# Patient Record
Sex: Female | Born: 1935 | Race: Black or African American | Hispanic: No | Smoking: Never smoker
Health system: Southern US, Community
[De-identification: ages and names within clinical notes are randomized; demographics above are authoritative.]

## PROBLEM LIST (undated history)

## (undated) DIAGNOSIS — N393 Stress incontinence (female) (male): Secondary | ICD-10-CM

## (undated) DIAGNOSIS — I6529 Occlusion and stenosis of unspecified carotid artery: Secondary | ICD-10-CM

## (undated) DIAGNOSIS — I4891 Unspecified atrial fibrillation: Secondary | ICD-10-CM

## (undated) DIAGNOSIS — N39490 Overflow incontinence: Secondary | ICD-10-CM

## (undated) DIAGNOSIS — I639 Cerebral infarction, unspecified: Secondary | ICD-10-CM

## (undated) HISTORY — PX: EYE SURGERY: SHX253

## (undated) HISTORY — DX: Occlusion and stenosis of unspecified carotid artery: I65.29

## (undated) HISTORY — DX: Unspecified atrial fibrillation: I48.91

## (undated) HISTORY — PX: CATARACT EXTRACTION: SUR2

---

## 2011-11-08 ENCOUNTER — Other Ambulatory Visit: Payer: Self-pay

## 2011-11-08 DIAGNOSIS — I6529 Occlusion and stenosis of unspecified carotid artery: Secondary | ICD-10-CM

## 2011-11-11 ENCOUNTER — Other Ambulatory Visit (INDEPENDENT_AMBULATORY_CARE_PROVIDER_SITE_OTHER): Payer: Medicare Other | Admitting: *Deleted

## 2011-11-11 ENCOUNTER — Encounter: Payer: Self-pay | Admitting: Vascular Surgery

## 2011-11-11 ENCOUNTER — Ambulatory Visit (INDEPENDENT_AMBULATORY_CARE_PROVIDER_SITE_OTHER): Payer: Medicare Other | Admitting: Vascular Surgery

## 2011-11-11 VITALS — BP 146/75 | HR 49 | Resp 18 | Ht 64.0 in | Wt 104.0 lb

## 2011-11-11 DIAGNOSIS — I63239 Cerebral infarction due to unspecified occlusion or stenosis of unspecified carotid arteries: Secondary | ICD-10-CM

## 2011-11-11 DIAGNOSIS — I6529 Occlusion and stenosis of unspecified carotid artery: Secondary | ICD-10-CM | POA: Insufficient documentation

## 2011-11-11 NOTE — Progress Notes (Signed)
Subjective:     Patient ID: Megan Davis, female   DOB: Sep 06, 1922, 78 y.o.   MRN: [id number]  HPI this 78 year old female is referred by Dr. Elby Beck for severe carotid occlusive disease and a recent left brain CVA. 6 days ago this patient developed right hemiparesis and was found to have a small left brain CVA by MRI scanning. She also was found to have a very tight left ICA stenosis by MR angiography. The patient regained normal neurologic function within 24 hours and was eventually discharged from the hospital and referred for possible carotid surgery. She was seen to have an acute nonhemorrhagic infarct in the posterior left insular cortex.   Past Medical History  Diagnosis Date  . Carotid artery occlusion     History  Substance Use Topics  . Smoking status: Never Smoker   . Smokeless tobacco: Never Used  . Alcohol Use: No    No family history on file.  No Known Allergies  Current outpatient prescriptions:calcium-vitamin D (OSCAL) 250-125 MG-UNIT per tablet, Take 1 tablet by mouth daily., Disp: , Rfl: ;  Multiple Vitamin (MULTIVITAMIN) tablet, Take 1 tablet by mouth daily., Disp: , Rfl:   BP 146/75  Pulse 49  Resp 18  Ht 5\' 4"  (1.626 m)  Wt 104 lb (47.174 kg)  BMI 17.85 kg/m2  Body mass index is 17.85 kg/(m^2).          Review of Systems complains of occasional dizziness but denies chest pain, dyspnea on exertion, PND, orthopnea, hemoptysis. Patient is frail but does ambulate significant amounts at home. Other systems negative and complete review of systems    Objective:   Physical Exam blood pressure 146/75 heart rate 49 respirations 18 Gen.-alert and oriented x3 in no apparent distress HEENT normal for age Lungs no rhonchi or wheezing Cardiovascular regular rhythm no murmurs carotid pulses 3+ palpable no bruits audible Abdomen soft nontender no palpable masses Musculoskeletal free of  major deformities Skin clear -no rashes Neurologic normal Lower  extremities 3+ femoral  palpable bilaterally with no edema-both feet well perfused  Today I reviewed the clinical data from previous hospitalization at [facility]. Also ordered a carotid duplex exam which I reviewed and interpreted. There is a very tight greater than 95% left ICA stenosis. The right ICA was not studied today in our office but is thought to be approximately 70% stenotic by MR angiography     Assessment:     #1 acute left nonhemorrhagic CVA in left insular cortex-6 days ago-symptoms completely resolved after 24 hours #2 greater than 95% left ICA stenosis #3 moderate right ICA stenosis by MR angiography #4 no history coronary artery disease    Plan:     Patient needs left carotid endarterectomy. Have scheduled this for Wednesday, October 16. Have thoroughly discussed risks and benefits with patient and family including 3% risk of stroke. Patient and family would like to proceed and will schedule her for next Wednesday October 16. Patient will continue aspirin daily and if has any new neurologic symptoms will be in touch with [place] immediately

## 2011-11-13 ENCOUNTER — Other Ambulatory Visit: Payer: Self-pay

## 2011-11-13 ENCOUNTER — Encounter (HOSPITAL_COMMUNITY): Payer: Self-pay | Admitting: Pharmacy Technician

## 2011-11-15 ENCOUNTER — Encounter (HOSPITAL_COMMUNITY)
Admission: RE | Admit: 2011-11-15 | Discharge: 2011-11-15 | Disposition: A | Discharge status: Home / Self Care | Payer: Medicare PPO | Source: Ambulatory Visit | Attending: Vascular Surgery | Admitting: Vascular Surgery

## 2011-11-15 ENCOUNTER — Encounter (HOSPITAL_COMMUNITY): Payer: Self-pay

## 2011-11-15 ENCOUNTER — Other Ambulatory Visit (HOSPITAL_COMMUNITY): Payer: Medicare Other

## 2011-11-15 DIAGNOSIS — Z01810 Encounter for preprocedural cardiovascular examination: Secondary | ICD-10-CM | POA: Insufficient documentation

## 2011-11-15 DIAGNOSIS — I6529 Occlusion and stenosis of unspecified carotid artery: Secondary | ICD-10-CM | POA: Insufficient documentation

## 2011-11-15 DIAGNOSIS — Z01812 Encounter for preprocedural laboratory examination: Secondary | ICD-10-CM | POA: Insufficient documentation

## 2011-11-15 DIAGNOSIS — Z01818 Encounter for other preprocedural examination: Secondary | ICD-10-CM | POA: Insufficient documentation

## 2011-11-15 HISTORY — DX: Stress incontinence (female) (male): N39.3

## 2011-11-15 HISTORY — DX: Overflow incontinence: N39.490

## 2011-11-15 HISTORY — DX: Cerebral infarction, unspecified: I63.9

## 2011-11-15 LAB — CBC
Hemoglobin: 10.5 g/dL — ABNORMAL LOW (ref 12.0–15.0)
Platelets: 163 10*3/uL (ref 150–400)
RBC: 3.65 MIL/uL — ABNORMAL LOW (ref 3.87–5.11)

## 2011-11-15 LAB — COMPREHENSIVE METABOLIC PANEL
ALT: 25 U/L (ref 0–35)
AST: 34 U/L (ref 0–37)
Alkaline Phosphatase: 63 U/L (ref 39–117)
CO2: 25 mEq/L (ref 19–32)
GFR calc Af Amer: 35 mL/min — ABNORMAL LOW (ref >90)
GFR calc non Af Amer: 31 mL/min — ABNORMAL LOW (ref >90)
Glucose, Bld: 81 mg/dL (ref 70–99)
Potassium: 4.8 mEq/L (ref 3.5–5.1)
Sodium: 138 mEq/L (ref 135–145)
Total Protein: 7.6 g/dL (ref 6.0–8.3)

## 2011-11-15 LAB — URINE MICROSCOPIC-ADD ON

## 2011-11-15 LAB — PROTIME-INR
INR: 1.11 (ref 0.00–1.49)
Prothrombin Time: 14.2 seconds (ref 11.6–15.2)

## 2011-11-15 LAB — URINALYSIS, ROUTINE W REFLEX MICROSCOPIC
Glucose, UA: NEGATIVE mg/dL
Ketones, ur: NEGATIVE mg/dL
Nitrite: NEGATIVE
Specific Gravity, Urine: 1.013 (ref 1.005–1.030)
pH: 5.5 (ref 5.0–8.0)

## 2011-11-15 LAB — TYPE AND SCREEN

## 2011-11-15 LAB — APTT: aPTT: 37 seconds (ref 24–37)

## 2011-11-15 NOTE — Progress Notes (Addendum)
LM at [facility], medical records, requesting old EKG, last office visit. Per pt's family she sees Dr. Archie Balboa.   Left chart for review of today's EKG, CXR.

## 2011-11-15 NOTE — Pre-Procedure Instructions (Signed)
[address] Megan Davis  11/15/2011   Your procedure is scheduled on:  Wednesday October 16  Report to [facility] at 8:30 AM.  Call this number if you have problems the morning of surgery: [phone]   Remember:   Do not eat or drink:After Midnight.    Take these medicines the morning of surgery with A SIP OF WATER: none   Do not wear jewelry, make-up or nail polish.  Do not wear lotions, powders, or perfumes. You may wear deodorant.  Do not shave 48 hours prior to surgery. Men may shave face and neck.  Do not bring valuables to the hospital.  Contacts, dentures or bridgework may not be worn into surgery.  Leave suitcase in the car. After surgery it may be brought to your room.  For patients admitted to the hospital, checkout time is 11:00 AM the day of discharge.   Patients discharged the day of surgery will not be allowed to drive home.  Name and phone number of your driver: na  Special Instructions: Shower using CHG 2 nights before surgery and the night before surgery.  If you shower the day of surgery use CHG.  Use special wash - you have one bottle of CHG for all showers.  You should use approximately 1/3 of the bottle for each shower.   Please read over the following fact sheets that you were given: Pain Booklet, Coughing and Deep Breathing, Blood Transfusion Information and Surgical Site Infection Prevention

## 2011-11-18 ENCOUNTER — Encounter (HOSPITAL_COMMUNITY): Payer: Self-pay | Admitting: Vascular Surgery

## 2011-11-18 NOTE — Consult Note (Signed)
Anesthesia Chart Review:  Patient is a 78 year old female scheduled for left CEA on 11/20/11 by Dr. Hart Rochester.  History includes recent left brain CVA ~ 11/05/11 with return to baseline neurologic function within 24 hours, non-smoker, stress incontinence.  She is followed at the [facility]), with PCP noted as Dr. Carmin Richmond.  His note from 10/09/11 and also mentions a history of murmur (not specified), HTN, dyslipidemia, and CKD.  Records from [facility] indicate that she has severe AS.  Labs noted.  Cr 1.49.  H/H 10.5/32.5.    CXR on 11/15/11 showed: Right suprahilar fullness. Adenopathy or hilar mass cannot be excluded although the appearance could be due to pulmonary hypertension in the setting of emphysema.  Follow-up chest CT recommended, preferably with infusion.   EKG on 11/15/11 showed SB @ 54 bpm, possible LAE, LVH, T wave abnormality, consider lateral ischemia. Records requested from [facility]) this morning and just arrived.  Her lateral ST/T wave changes were more non-specific on her 11/06/11 EKG from [facility].  Echo on 11/07/11 showed severe and EF, mean gradient of aortic valve is 33 mmHg, aortic valve area by VTI is calculated at 0.84 cm, difficult to rule out possible vegetation atrial side of anterior mitral leaflet, difficult to be conclusive due to myxomatous changes and mitral annular calcification. Suggest TEE if indicated with history of CVA and to rule out SBE if clinical presentation consistent. The mitral valve leaflets are moderately thickened, there is no evidence of mild regurgitation, estimated EF greater than 65%, left ventricle appears hyperdynamic, Doppler findings suggestive of diastolic dysfunction, left ventricular diastolic filling pattern is consistent with elevated mean left atrial pressure, moderate tricuspid regurgitation, the right ventricle systolic pressures estimated to be 35-40 mmHg, evidence of mild pulmonary hypertension,  inferior vena cava appears normal in size, less than 50% respiratory changes inferior vena cava dimension.  Carotid duplex on 11/07/11 [facility]) showed 70-99% left ICA stenosis, 50-69% right ICA stenosis.  I reviewed above with Anesthesiologist Dr. Krista Blue who agrees patient should undergo a cardiology evaluation for abnormal EKG and severe AS.  I notified Okey Regal at [facility] and also notified her of patient's abnormal CXR.  Dr. Hart Rochester plans to postpone surgery until patient is cleared by [facility].  Shonna Chock, PA-C

## 2011-11-19 ENCOUNTER — Telehealth: Payer: Self-pay | Admitting: Vascular Surgery

## 2011-11-19 ENCOUNTER — Encounter: Payer: Self-pay | Admitting: Vascular Surgery

## 2011-11-19 ENCOUNTER — Other Ambulatory Visit: Payer: Self-pay

## 2011-11-19 ENCOUNTER — Other Ambulatory Visit: Payer: Self-pay | Admitting: *Deleted

## 2011-11-19 DIAGNOSIS — Z01818 Encounter for other preprocedural examination: Secondary | ICD-10-CM

## 2011-11-19 DIAGNOSIS — I6529 Occlusion and stenosis of unspecified carotid artery: Secondary | ICD-10-CM

## 2011-11-19 NOTE — Telephone Encounter (Signed)
Message copied by Fredrich Birks on Tue Nov 19, 2011  2:58 PM ------      Message from: Phillips Odor      Created: Mon Nov 18, 2011  4:53 PM      Regarding: needs cardiac clearance      Contact: [phone]       Please schedule pt. For a cardiology consult with [facility].  Pt's. surgery for carotid endarterectomy was cancelled due to need for Cardiac clearance.  Please try to get the CC ASAP, so her CEA can get rescheduled.

## 2011-11-19 NOTE — Telephone Encounter (Signed)
[facility] could not see pt for 2 weeks, so I scheduled with [facility] for 11/22/11 @ 2:15pm. Notified daughter Burnell Blanks, dpm

## 2011-11-20 ENCOUNTER — Encounter (HOSPITAL_COMMUNITY): Admission: RE | Payer: Self-pay | Source: Ambulatory Visit

## 2011-11-20 ENCOUNTER — Ambulatory Visit (HOSPITAL_COMMUNITY): Admission: RE | Admit: 2011-11-20 | Payer: Medicare PPO | Source: Ambulatory Visit | Admitting: Vascular Surgery

## 2011-11-20 SURGERY — ENDARTERECTOMY, CAROTID
Anesthesia: General | Site: Neck | Laterality: Left

## 2011-11-28 ENCOUNTER — Ambulatory Visit: Payer: Medicare Other | Admitting: Cardiovascular Disease

## 2011-12-04 ENCOUNTER — Encounter: Payer: Self-pay | Admitting: *Deleted

## 2011-12-05 ENCOUNTER — Other Ambulatory Visit: Payer: Self-pay | Admitting: *Deleted

## 2011-12-11 MED ORDER — DEXTROSE 5 % IV SOLN
1.5000 g | INTRAVENOUS | Status: AC
  Administered 2011-12-12: 1.5 g via INTRAVENOUS
  Filled 2011-12-11: qty 1.5

## 2011-12-11 NOTE — Consult Note (Signed)
Anesthesia Chart Review: Patient is a 78 year old female scheduled for left CEA on 12/12/11 11/20/11 by Dr. Hart Rochester. She was initially scheduled for 11/20/11, but cancelled due to need for cardiac evaluation due to finding of severe AS on recent echo.  She is scheduled to be a same day work-up.  History includes recent left brain CVA ~ 11/05/11 with return to baseline neurologic function within 24 hours, non-smoker, stress incontinence, murmur (severe AS by echo 11/2011), HTN, dyslipidemia, and CKD.  She is followed at the [facility] FP/[facility]), with PCP noted as Dr. Carmin Richmond.  CXR on 11/15/11 showed:  Right suprahilar fullness. Adenopathy or hilar mass cannot be excluded although the appearance could be due to pulmonary hypertension in the setting of emphysema.  Follow-up chest CT recommended, preferably with infusion. (I called this result to Lamar Blinks, RNon 11/18/11.)  Leandro Reasoner, RN to re-request EKG and echo from [facility], but according to my note on 11/18/11: EKG on 11/15/11 showed SB @ 54 bpm, possible LAE, LVH, T wave abnormality, consider lateral ischemia. Her lateral ST/T wave changes were more non-specific on her 11/06/11 EKG from [facility].   Echo on 11/07/11 showed severe and EF, mean gradient of aortic valve is 33 mmHg, aortic valve area by VTI is calculated at 0.84 cm, difficult to rule out possible vegetation atrial side of anterior mitral leaflet, difficult to be conclusive due to myxomatous changes and mitral annular calcification. Suggest TEE if indicated with history of CVA and to rule out SBE if clinical presentation consistent. The mitral valve leaflets are moderately thickened, there is no evidence of mild regurgitation, estimated EF greater than 65%, left ventricle appears hyperdynamic, Doppler findings suggestive of diastolic dysfunction, left ventricular diastolic filling pattern is consistent with elevated mean left atrial pressure,  moderate tricuspid regurgitation, the right ventricle systolic pressures estimated to be 35-40 mmHg, evidence of mild pulmonary hypertension, inferior vena cava appears normal in size, less than 50% respiratory changes inferior vena cava dimension.   Carotid duplex on 11/07/11 [facility]) showed 70-99% left ICA stenosis, 50-69% right ICA stenosis.   Patient has since been evaluated by Dr. Nicki Guadalajara [facility]).  She had a nuclear stress test on 11/27/2011 that showed normal myocardial perfusion study, no significant ischemia demonstrated. Post stress EF 80%. No significant wall motion abnormalities. Low risk scan.  He also reviewed her echo from [facility].  He ultimately cleared her for this procedure with moderate cardiovascular risk with recommendations to avoid hypotension.  Patient is for labs on arrival.  She will be evaluated by her assigned anesthesiologist on the day of surgery.  Shonna Chock, PA-C 12/11/11 1411

## 2011-12-11 NOTE — Progress Notes (Signed)
Located chart from prior PAT appointment. ECHO, EKG, stress test, cardiac clearance note from [facility] on chart.

## 2011-12-11 NOTE — Progress Notes (Signed)
spoke with [facility] hospital medical records they do not have a stress test but they will send echo and ekg.

## 2011-12-12 ENCOUNTER — Inpatient Hospital Stay (HOSPITAL_COMMUNITY)
Admission: RE | Admit: 2011-12-12 | Discharge: 2011-12-13 | DRG: 039 | Disposition: A | Discharge status: Home / Self Care | Payer: Medicare PPO | Source: Ambulatory Visit | Attending: Vascular Surgery | Admitting: Vascular Surgery

## 2011-12-12 ENCOUNTER — Encounter (HOSPITAL_COMMUNITY): Payer: Self-pay | Admitting: *Deleted

## 2011-12-12 ENCOUNTER — Encounter (HOSPITAL_COMMUNITY): Payer: Self-pay | Admitting: Vascular Surgery

## 2011-12-12 ENCOUNTER — Inpatient Hospital Stay (HOSPITAL_COMMUNITY): Payer: Medicare PPO | Admitting: Vascular Surgery

## 2011-12-12 ENCOUNTER — Encounter (HOSPITAL_COMMUNITY)
Admission: RE | Disposition: A | Discharge status: Home / Self Care | Payer: Self-pay | Source: Ambulatory Visit | Attending: Vascular Surgery

## 2011-12-12 ENCOUNTER — Telehealth: Payer: Self-pay | Admitting: Vascular Surgery

## 2011-12-12 DIAGNOSIS — I2789 Other specified pulmonary heart diseases: Secondary | ICD-10-CM | POA: Diagnosis present

## 2011-12-12 DIAGNOSIS — N189 Chronic kidney disease, unspecified: Secondary | ICD-10-CM | POA: Diagnosis present

## 2011-12-12 DIAGNOSIS — I63239 Cerebral infarction due to unspecified occlusion or stenosis of unspecified carotid arteries: Secondary | ICD-10-CM

## 2011-12-12 DIAGNOSIS — Z79899 Other long term (current) drug therapy: Secondary | ICD-10-CM

## 2011-12-12 DIAGNOSIS — Z8673 Personal history of transient ischemic attack (TIA), and cerebral infarction without residual deficits: Secondary | ICD-10-CM

## 2011-12-12 DIAGNOSIS — R011 Cardiac murmur, unspecified: Secondary | ICD-10-CM | POA: Diagnosis present

## 2011-12-12 DIAGNOSIS — I6529 Occlusion and stenosis of unspecified carotid artery: Principal | ICD-10-CM | POA: Diagnosis present

## 2011-12-12 DIAGNOSIS — I079 Rheumatic tricuspid valve disease, unspecified: Secondary | ICD-10-CM | POA: Diagnosis present

## 2011-12-12 DIAGNOSIS — I080 Rheumatic disorders of both mitral and aortic valves: Secondary | ICD-10-CM | POA: Diagnosis present

## 2011-12-12 DIAGNOSIS — I129 Hypertensive chronic kidney disease with stage 1 through stage 4 chronic kidney disease, or unspecified chronic kidney disease: Secondary | ICD-10-CM | POA: Diagnosis present

## 2011-12-12 DIAGNOSIS — N393 Stress incontinence (female) (male): Secondary | ICD-10-CM | POA: Diagnosis present

## 2011-12-12 DIAGNOSIS — I658 Occlusion and stenosis of other precerebral arteries: Secondary | ICD-10-CM | POA: Diagnosis present

## 2011-12-12 DIAGNOSIS — E785 Hyperlipidemia, unspecified: Secondary | ICD-10-CM | POA: Diagnosis present

## 2011-12-12 HISTORY — PX: PATCH ANGIOPLASTY: SHX6230

## 2011-12-12 HISTORY — PX: ENDARTERECTOMY: SHX5162

## 2011-12-12 LAB — CBC
MCH: 29.3 pg (ref 26.0–34.0)
MCV: 90.3 fL (ref 78.0–100.0)
Platelets: 153 10*3/uL (ref 150–400)
RDW: 12.4 % (ref 11.5–15.5)
WBC: 4.5 10*3/uL (ref 4.0–10.5)

## 2011-12-12 LAB — COMPREHENSIVE METABOLIC PANEL
ALT: 14 U/L (ref 0–35)
AST: 22 U/L (ref 0–37)
Albumin: 3.6 g/dL (ref 3.5–5.2)
Calcium: 10 mg/dL (ref 8.4–10.5)
Chloride: 103 mEq/L (ref 96–112)
Creatinine, Ser: 1.54 mg/dL — ABNORMAL HIGH (ref 0.50–1.10)
Sodium: 140 mEq/L (ref 135–145)

## 2011-12-12 LAB — PROTIME-INR
INR: 1.07 (ref 0.00–1.49)
Prothrombin Time: 13.8 seconds (ref 11.6–15.2)

## 2011-12-12 LAB — TYPE AND SCREEN: ABO/RH(D): O POS

## 2011-12-12 SURGERY — ENDARTERECTOMY, CAROTID
Anesthesia: General | Site: Neck | Laterality: Left | Wound class: Clean

## 2011-12-12 MED ORDER — SODIUM CHLORIDE 0.9 % IV SOLN
10.0000 mg | INTRAVENOUS | Status: DC | PRN
  Administered 2011-12-12: 25 ug/min via INTRAVENOUS

## 2011-12-12 MED ORDER — SODIUM CHLORIDE 0.9 % IR SOLN
Status: DC | PRN
  Administered 2011-12-12: 08:00:00

## 2011-12-12 MED ORDER — SODIUM CHLORIDE 0.9 % IV SOLN: 500.0000 mL | Freq: Once | INTRAVENOUS | Status: AC | PRN

## 2011-12-12 MED ORDER — FENTANYL CITRATE 0.05 MG/ML IJ SOLN: 25.0000 ug | INTRAMUSCULAR | Status: DC | PRN

## 2011-12-12 MED ORDER — ONDANSETRON HCL 4 MG/2ML IJ SOLN
INTRAMUSCULAR | Status: DC | PRN
  Administered 2011-12-12: 4 mg via INTRAVENOUS

## 2011-12-12 MED ORDER — MORPHINE SULFATE 2 MG/ML IJ SOLN
INTRAMUSCULAR | Status: AC
  Filled 2011-12-12: qty 1

## 2011-12-12 MED ORDER — ACETAMINOPHEN 325 MG PO TABS: 325.0000 mg | ORAL_TABLET | ORAL | Status: DC | PRN

## 2011-12-12 MED ORDER — TEMAZEPAM 15 MG PO CAPS: 15.0000 mg | ORAL_CAPSULE | Freq: Every evening | ORAL | Status: DC | PRN

## 2011-12-12 MED ORDER — ASPIRIN EC 325 MG PO TBEC
325.0000 mg | DELAYED_RELEASE_TABLET | Freq: Every day | ORAL | Status: DC
  Administered 2011-12-12 – 2011-12-13 (×2): 325 mg via ORAL
  Filled 2011-12-12 (×2): qty 1

## 2011-12-12 MED ORDER — GUAIFENESIN-DM 100-10 MG/5ML PO SYRP: 15.0000 mL | ORAL_SOLUTION | ORAL | Status: DC | PRN

## 2011-12-12 MED ORDER — OXYCODONE HCL 5 MG/5ML PO SOLN: 5.0000 mg | Freq: Once | ORAL | Status: DC | PRN

## 2011-12-12 MED ORDER — ADULT MULTIVITAMIN W/MINERALS CH
1.0000 | ORAL_TABLET | Freq: Every day | ORAL | Status: DC
  Administered 2011-12-12 – 2011-12-13 (×2): 1 via ORAL
  Filled 2011-12-12 (×2): qty 1

## 2011-12-12 MED ORDER — ROCURONIUM BROMIDE 100 MG/10ML IV SOLN
INTRAVENOUS | Status: DC | PRN
  Administered 2011-12-12: 40 mg via INTRAVENOUS

## 2011-12-12 MED ORDER — DOPAMINE-DEXTROSE 3.2-5 MG/ML-% IV SOLN: 3.0000 ug/kg/min | INTRAVENOUS | Status: DC

## 2011-12-12 MED ORDER — DOCUSATE SODIUM 100 MG PO CAPS
100.0000 mg | ORAL_CAPSULE | Freq: Every day | ORAL | Status: DC
  Administered 2011-12-13: 100 mg via ORAL
  Filled 2011-12-12: qty 1

## 2011-12-12 MED ORDER — SODIUM CHLORIDE 0.9 % IV SOLN: INTRAVENOUS | Status: DC

## 2011-12-12 MED ORDER — GLYCOPYRROLATE 0.2 MG/ML IJ SOLN
INTRAMUSCULAR | Status: DC | PRN
  Administered 2011-12-12: 0.4 mg via INTRAVENOUS

## 2011-12-12 MED ORDER — NEOSTIGMINE METHYLSULFATE 1 MG/ML IJ SOLN
INTRAMUSCULAR | Status: DC | PRN
  Administered 2011-12-12: 2 mg via INTRAVENOUS

## 2011-12-12 MED ORDER — PHENOL 1.4 % MT LIQD: 1.0000 | OROMUCOSAL | Status: DC | PRN

## 2011-12-12 MED ORDER — 0.9 % SODIUM CHLORIDE (POUR BTL) OPTIME
TOPICAL | Status: DC | PRN
  Administered 2011-12-12: 2000 mL

## 2011-12-12 MED ORDER — OXYCODONE HCL 5 MG PO TABS: 5.0000 mg | ORAL_TABLET | Freq: Four times a day (QID) | ORAL | Status: DC | PRN

## 2011-12-12 MED ORDER — OXYCODONE HCL 5 MG PO TABS: 5.0000 mg | ORAL_TABLET | ORAL | Status: DC | PRN

## 2011-12-12 MED ORDER — FENTANYL CITRATE 0.05 MG/ML IJ SOLN
INTRAMUSCULAR | Status: DC | PRN
  Administered 2011-12-12 (×2): 50 ug via INTRAVENOUS
  Administered 2011-12-12: 100 ug via INTRAVENOUS

## 2011-12-12 MED ORDER — HEPARIN SODIUM (PORCINE) 1000 UNIT/ML IJ SOLN
INTRAMUSCULAR | Status: DC | PRN
  Administered 2011-12-12: 6000 [IU] via INTRAVENOUS

## 2011-12-12 MED ORDER — ARTIFICIAL TEARS OP OINT
TOPICAL_OINTMENT | OPHTHALMIC | Status: DC | PRN
  Administered 2011-12-12: 1 via OPHTHALMIC

## 2011-12-12 MED ORDER — LIDOCAINE HCL (PF) 1 % IJ SOLN
INTRAMUSCULAR | Status: AC
  Filled 2011-12-12: qty 30

## 2011-12-12 MED ORDER — AMLODIPINE BESYLATE 2.5 MG PO TABS
2.5000 mg | ORAL_TABLET | Freq: Every day | ORAL | Status: DC
  Administered 2011-12-12 – 2011-12-13 (×2): 2.5 mg via ORAL
  Filled 2011-12-12 (×2): qty 1

## 2011-12-12 MED ORDER — PROPOFOL 10 MG/ML IV BOLUS
INTRAVENOUS | Status: DC | PRN
  Administered 2011-12-12: 120 mg via INTRAVENOUS

## 2011-12-12 MED ORDER — LIDOCAINE HCL (CARDIAC) 20 MG/ML IV SOLN
INTRAVENOUS | Status: DC | PRN
  Administered 2011-12-12: 60 mg via INTRAVENOUS

## 2011-12-12 MED ORDER — LACTATED RINGERS IV SOLN
INTRAVENOUS | Status: DC | PRN
  Administered 2011-12-12: 07:00:00 via INTRAVENOUS

## 2011-12-12 MED ORDER — PANTOPRAZOLE SODIUM 40 MG PO TBEC
40.0000 mg | DELAYED_RELEASE_TABLET | Freq: Every day | ORAL | Status: DC
  Administered 2011-12-12 – 2011-12-13 (×2): 40 mg via ORAL
  Filled 2011-12-12 (×2): qty 1

## 2011-12-12 MED ORDER — DEXAMETHASONE SODIUM PHOSPHATE 4 MG/ML IJ SOLN
INTRAMUSCULAR | Status: DC | PRN
  Administered 2011-12-12: 4 mg via INTRAVENOUS

## 2011-12-12 MED ORDER — OXYCODONE HCL 5 MG PO TABS: 5.0000 mg | ORAL_TABLET | Freq: Once | ORAL | Status: DC | PRN

## 2011-12-12 MED ORDER — MORPHINE SULFATE 2 MG/ML IJ SOLN
2.0000 mg | INTRAMUSCULAR | Status: DC | PRN
  Administered 2011-12-12: 2 mg via INTRAVENOUS

## 2011-12-12 MED ORDER — CALCIUM CARBONATE-VITAMIN D 500-200 MG-UNIT PO TABS
1.0000 | ORAL_TABLET | Freq: Every day | ORAL | Status: DC
  Administered 2011-12-12: 1 via ORAL
  Filled 2011-12-12 (×2): qty 1

## 2011-12-12 MED ORDER — LABETALOL HCL 5 MG/ML IV SOLN: 10.0000 mg | INTRAVENOUS | Status: DC | PRN

## 2011-12-12 MED ORDER — ONDANSETRON HCL 4 MG/2ML IJ SOLN
4.0000 mg | Freq: Four times a day (QID) | INTRAMUSCULAR | Status: DC | PRN
  Administered 2011-12-12: 4 mg via INTRAVENOUS
  Filled 2011-12-12: qty 2

## 2011-12-12 MED ORDER — ALUM & MAG HYDROXIDE-SIMETH 200-200-20 MG/5ML PO SUSP: 15.0000 mL | ORAL | Status: DC | PRN

## 2011-12-12 MED ORDER — SODIUM CHLORIDE 0.9 % IV SOLN
INTRAVENOUS | Status: DC
  Administered 2011-12-12: 23:00:00 via INTRAVENOUS

## 2011-12-12 MED ORDER — METOPROLOL TARTRATE 1 MG/ML IV SOLN: 2.0000 mg | INTRAVENOUS | Status: DC | PRN

## 2011-12-12 MED ORDER — POTASSIUM CHLORIDE CRYS ER 20 MEQ PO TBCR: 20.0000 meq | EXTENDED_RELEASE_TABLET | Freq: Once | ORAL | Status: AC | PRN

## 2011-12-12 MED ORDER — ACETAMINOPHEN 650 MG RE SUPP: 325.0000 mg | RECTAL | Status: DC | PRN

## 2011-12-12 MED ORDER — DEXTROSE 5 % IV SOLN
1.5000 g | Freq: Two times a day (BID) | INTRAVENOUS | Status: AC
  Administered 2011-12-12 – 2011-12-13 (×2): 1.5 g via INTRAVENOUS
  Filled 2011-12-12 (×2): qty 1.5

## 2011-12-12 MED ORDER — METOCLOPRAMIDE HCL 5 MG/ML IJ SOLN: 10.0000 mg | Freq: Once | INTRAMUSCULAR | Status: DC | PRN

## 2011-12-12 MED ORDER — DEXTROSE 5 % IV SOLN
1.5000 g | INTRAVENOUS | Status: DC
  Filled 2011-12-12: qty 1.5

## 2011-12-12 MED ORDER — HYDRALAZINE HCL 20 MG/ML IJ SOLN
INTRAMUSCULAR | Status: AC
  Filled 2011-12-12: qty 1

## 2011-12-12 MED ORDER — PROTAMINE SULFATE 10 MG/ML IV SOLN
INTRAVENOUS | Status: DC | PRN
  Administered 2011-12-12 (×5): 10 mg via INTRAVENOUS

## 2011-12-12 MED ORDER — HYDRALAZINE HCL 20 MG/ML IJ SOLN
10.0000 mg | INTRAMUSCULAR | Status: DC | PRN
  Administered 2011-12-12: 10 mg via INTRAVENOUS
  Filled 2011-12-12: qty 0.5

## 2011-12-12 SURGICAL SUPPLY — 47 items
CANISTER SUCTION 2500CC (MISCELLANEOUS) ×2 IMPLANT
CATH ROBINSON RED A/P 18FR (CATHETERS) ×2 IMPLANT
CATH SUCT 10FR WHISTLE TIP (CATHETERS) ×2 IMPLANT
CLIP TI MEDIUM 24 (CLIP) ×2 IMPLANT
CLIP TI WIDE RED SMALL 24 (CLIP) ×2 IMPLANT
CLOTH BEACON ORANGE TIMEOUT ST (SAFETY) ×2 IMPLANT
COVER SURGICAL LIGHT HANDLE (MISCELLANEOUS) ×2 IMPLANT
CRADLE DONUT ADULT HEAD (MISCELLANEOUS) ×2 IMPLANT
DECANTER SPIKE VIAL GLASS SM (MISCELLANEOUS) IMPLANT
DRAIN HEMOVAC 1/8 X 5 (WOUND CARE) IMPLANT
DRAPE WARM FLUID 44X44 (DRAPE) ×2 IMPLANT
DRSG COVADERM 4X8 (GAUZE/BANDAGES/DRESSINGS) ×2 IMPLANT
ELECT REM PT RETURN 9FT ADLT (ELECTROSURGICAL) ×2
ELECTRODE REM PT RTRN 9FT ADLT (ELECTROSURGICAL) ×1 IMPLANT
EVACUATOR SILICONE 100CC (DRAIN) IMPLANT
GLOVE BIOGEL M 6.5 STRL (GLOVE) ×4 IMPLANT
GLOVE BIOGEL PI IND STRL 6.5 (GLOVE) ×1 IMPLANT
GLOVE BIOGEL PI IND STRL 7.5 (GLOVE) ×1 IMPLANT
GLOVE BIOGEL PI INDICATOR 6.5 (GLOVE) ×1
GLOVE BIOGEL PI INDICATOR 7.5 (GLOVE) ×1
GLOVE SS BIOGEL STRL SZ 7 (GLOVE) ×1 IMPLANT
GLOVE SUPERSENSE BIOGEL SZ 7 (GLOVE) ×1
GLOVE SURG SS PI 6.5 STRL IVOR (GLOVE) ×2 IMPLANT
GLOVE SURG SS PI 7.0 STRL IVOR (GLOVE) ×2 IMPLANT
GLOVE SURG SS PI 7.5 STRL IVOR (GLOVE) ×2 IMPLANT
GOWN PREVENTION PLUS XLARGE (GOWN DISPOSABLE) ×2 IMPLANT
GOWN STRL NON-REIN LRG LVL3 (GOWN DISPOSABLE) ×4 IMPLANT
GOWN STRL REIN XL XLG (GOWN DISPOSABLE) ×2 IMPLANT
INSERT FOGARTY SM (MISCELLANEOUS) ×2 IMPLANT
KIT BASIN OR (CUSTOM PROCEDURE TRAY) ×2 IMPLANT
KIT ROOM TURNOVER OR (KITS) ×2 IMPLANT
NEEDLE 22X1 1/2 (OR ONLY) (NEEDLE) IMPLANT
NS IRRIG 1000ML POUR BTL (IV SOLUTION) ×4 IMPLANT
PACK CAROTID (CUSTOM PROCEDURE TRAY) ×2 IMPLANT
PAD ARMBOARD 7.5X6 YLW CONV (MISCELLANEOUS) ×4 IMPLANT
PATCH HEMASHIELD 8X75 (Vascular Products) ×2 IMPLANT
SHUNT CAROTID BYPASS 12FRX15.5 (VASCULAR PRODUCTS) IMPLANT
SPECIMEN JAR SMALL (MISCELLANEOUS) ×2 IMPLANT
SUT PROLENE 6 0 CC (SUTURE) ×4 IMPLANT
SUT SILK 2 0 FS (SUTURE) ×2 IMPLANT
SUT VIC AB 2-0 CT1 27 (SUTURE) ×1
SUT VIC AB 2-0 CT1 TAPERPNT 27 (SUTURE) ×1 IMPLANT
SUT VIC AB 3-0 X1 27 (SUTURE) ×2 IMPLANT
SYR CONTROL 10ML LL (SYRINGE) IMPLANT
TOWEL OR 17X24 6PK STRL BLUE (TOWEL DISPOSABLE) ×2 IMPLANT
TOWEL OR 17X26 10 PK STRL BLUE (TOWEL DISPOSABLE) ×2 IMPLANT
WATER STERILE IRR 1000ML POUR (IV SOLUTION) ×2 IMPLANT

## 2011-12-12 NOTE — Progress Notes (Signed)
Pt arrived from PACU, VSS, A&O, will continue to monitor

## 2011-12-12 NOTE — Anesthesia Postprocedure Evaluation (Signed)
Anesthesia Post Note  Patient: Megan Davis  Procedure(s) Performed: Procedure(s) (LRB): ENDARTERECTOMY CAROTID (Left) PATCH ANGIOPLASTY (Left)  Anesthesia type: general  Patient location: PACU  Post pain: Pain level controlled  Post assessment: Patient's Cardiovascular Status Stable  Last Vitals:  Filed Vitals:   12/12/11 1030  BP: 99/31  Pulse:   Temp: 36.2 C  Resp:     Post vital signs: Reviewed and stable  Level of consciousness: sedated  Complications: No apparent anesthesia complications

## 2011-12-12 NOTE — Progress Notes (Signed)
Report received 

## 2011-12-12 NOTE — Interval H&P Note (Signed)
History and Physical Interval Note:  12/12/2011 7:26 AM  Megan Davis  has presented today for surgery, with the diagnosis of LEFT ICA STENOSIS  The various methods of treatment have been discussed with the patient and family. After consideration of risks, benefits and other options for treatment, the patient has consented to  Procedure(s) (LRB) with comments: ENDARTERECTOMY CAROTID (Left) as a surgical intervention .  The patient's history has been reviewed, patient examined, no change in status, stable for surgery.  I have reviewed the patient's chart and labs.  Questions were answered to the patient's satisfaction.     [name]

## 2011-12-12 NOTE — Preoperative (Signed)
Beta Blockers   Reason not to administer Beta Blockers:Not Applicable 

## 2011-12-12 NOTE — Progress Notes (Signed)
Lunch relief provided. 

## 2011-12-12 NOTE — H&P (View-Only) (Signed)
Anesthesia Chart Review: Patient is a 78 year old female scheduled for left CEA on 12/12/11 11/20/11 by Dr. Lawson. She was initially scheduled for 11/20/11, but cancelled due to need for cardiac evaluation due to finding of severe AS on recent echo.  She is scheduled to be a same day work-up.  History includes recent left brain CVA ~ 11/05/11 with return to baseline neurologic function within 24 hours, non-smoker, stress incontinence, murmur (severe AS by echo 11/2011), HTN, dyslipidemia, and CKD.  She is followed at the [facility] with PCP noted as Dr. Ali M Hama Amin.  CXR on 11/15/11 showed:  Right suprahilar fullness. Adenopathy or hilar mass cannot be excluded although the appearance could be due to pulmonary hypertension in the setting of emphysema.  Follow-up chest CT recommended, preferably with infusion. (I called this result to Carol Pullins, RNon 11/18/11.)  Michelle Savage, RN to re-request EKG and echo from [facility], but according to my note on 11/18/11: EKG on 11/15/11 showed SB @ 54 bpm, possible LAE, LVH, T wave abnormality, consider lateral ischemia. Her lateral ST/T wave changes were more non-specific on her 11/06/11 EKG from [facility].   Echo on 11/07/11 showed severe and EF, mean gradient of aortic valve is 33 mmHg, aortic valve area by VTI is calculated at 0.84 cm, difficult to rule out possible vegetation atrial side of anterior mitral leaflet, difficult to be conclusive due to myxomatous changes and mitral annular calcification. Suggest TEE if indicated with history of CVA and to rule out SBE if clinical presentation consistent. The mitral valve leaflets are moderately thickened, there is no evidence of mild regurgitation, estimated EF greater than 65%, left ventricle appears hyperdynamic, Doppler findings suggestive of diastolic dysfunction, left ventricular diastolic filling pattern is consistent with elevated mean left atrial pressure,  moderate tricuspid regurgitation, the right ventricle systolic pressures estimated to be 35-40 mmHg, evidence of mild pulmonary hypertension, inferior vena cava appears normal in size, less than 50% respiratory changes inferior vena cava dimension.   Carotid duplex on 11/07/11 ([facility]) showed 70-99% left ICA stenosis, 50-69% right ICA stenosis.   Patient has since been evaluated by Dr. Thomas Kelly ([facility]).  She had a nuclear stress test on 11/27/2011 that showed normal myocardial perfusion study, no significant ischemia demonstrated. Post stress EF 80%. No significant wall motion abnormalities. Low risk scan.  He also reviewed her echo from [facility].  He ultimately cleared her for this procedure with moderate cardiovascular risk with recommendations to avoid hypotension.  Patient is for labs on arrival.  She will be evaluated by her assigned anesthesiologist on the day of surgery.  Allison Zelenak, PA-C 12/11/11 1411 

## 2011-12-12 NOTE — Op Note (Signed)
OPERATIVE REPORT  Date of Surgery: 12/12/2011  Surgeon: [name], MD  Assistant: Doreatha Massed, PA  Pre-op Diagnosis: LEFT Internal Carotid Artery   STENOSIS  S-P CVA Post-op Diagnosis: same Procedure: Procedure(s): ENDARTERECTOMY CAROTID  Left PATCH ANGIOPLASTY  Anesthesia: General  EBL: minimal  Complications: None  Procedure Details: The patient was taken to the operating room and placed in the supine position. Following induction of satisfactory general endotracheal anesthesia the left neck was prepped and draped in a routine sterile manner. Incision was made on the anterior border of the sternocleidomastoid muscle and carried down through the subcutaneous tissue and platysma using the Bovie. Care was taken not to injure the hypoglossal nerve.. The common internal and external carotid arteries were dissected free. There was a calcified atherosclerotic plaque at the carotid bifurcation extending up the internal carotid artery. A #10 shunt was then prepared and the patient was heparinized. The carotid vessels were occluded with vascular clamps. A longitudinal opening was made in the common carotid with a 15 blade extended up the internal carotid with the Potts scissors to a point distal to the disease. The plaque was approximately 95% stenotic in severity. The distal vessel appeared normal. Shunt was inserted without difficulty reestablishing flow in about 2 minutes. A standard endarterectomy was performed with an eversion endarterectomy of the external carotid. The plaque feathered off  the distal internal carotid artery nicely not requiring any tacking sutures. The lumen was thoroughly irrigated with heparinized saline and loose debris all carefully removed. The arterotomy was then closed with a patch using continuous 6-0 Prolene. Prior to completion of the  Closure the  shunt was removed after approximately 30 minutes of shunt time. Flow was then reestablished up the external branch  initially followed by the internal branch. Protamine was given to her reverse the heparin.Following adequate hemostasis the wound was irrigated with saline and closed in layers with Vicryl ain a subcuticular fashion. Sterile dressing was applied and the patient taken to the recovery room in stable condition.  [name], MD 12/12/2011 9:19 AM

## 2011-12-12 NOTE — Progress Notes (Signed)
Patient ID: Indyah Mae Arnold, female   DOB: 10/25/1922, 78 y.o.   MRN: [id number]   Shanyia Mae Counsell   11/11/2011 2:00 PM Office Visit  MRN: [id number]   Description: 78 year old female  Provider: Elijan Googe D Samuele Storey, MD  Department: [facility]        Diagnoses     Occlusion and stenosis of carotid artery with cerebral infarction   - Primary    433.11      Reason for Visit     Carotid    NEW CAROTID        Vitals - Last Recorded       BP Pulse Resp Ht Wt BMI    146/75 49 18 5' 4" (1.626 m) 104 lb (47.174 kg) 17.85 kg/m2       Progress Notes     Erroll Wilbourne, MD  11/11/2011  3:11 PM  SignedSubjective:      Patient ID: Windie Mae Alkema, female   DOB: 05/07/1922, 78 y.o.   MRN: [id number]   HPI this 88-year-old female is referred by Dr. Jim Parsons for severe carotid occlusive disease and a recent left brain CVA. 6 days ago this patient developed right hemiparesis and was found to have a small left brain CVA by MRI scanning. She also was found to have a very tight left ICA stenosis by MR angiography. The patient regained normal neurologic function within 24 hours and was eventually discharged from the hospital and referred for possible carotid surgery. She was seen to have an acute nonhemorrhagic infarct in the posterior left insular cortex.     Past Medical History   Diagnosis  Date   .  Carotid artery occlusion         History   Substance Use Topics   .  Smoking status:  Never Smoker    .  Smokeless tobacco:  Never Used   .  Alcohol Use:  No      No family history on file.   No Known Allergies   Current outpatient prescriptions:calcium-vitamin D (OSCAL) 250-125 MG-UNIT per tablet, Take 1 tablet by mouth daily., Disp: , Rfl: ;  Multiple Vitamin (MULTIVITAMIN) tablet, Take 1 tablet by mouth daily., Disp: , Rfl:    BP 146/75  Pulse 49  Resp 18  Ht 5' 4" (1.626 m)  Wt 104 lb (47.174 kg)  BMI 17.85 kg/m2   Body mass index is 17.85  kg/(m^2).                   Review of Systems complains of occasional dizziness but denies chest pain, dyspnea on exertion, PND, orthopnea, hemoptysis. Patient is frail but does ambulate significant amounts at home. Other systems negative and complete review of systems   Objective:    Physical Exam blood pressure 146/75 heart rate 49 respirations 18 Gen.-alert and oriented x3 in no apparent distress HEENT normal for age Lungs no rhonchi or wheezing Cardiovascular regular rhythm no murmurs carotid pulses 3+ palpable no bruits audible Abdomen soft nontender no palpable masses Musculoskeletal free of  major deformities Skin clear -no rashes Neurologic normal Lower extremities 3+ femoral  palpable bilaterally with no edema-both feet well perfused   Today I reviewed the clinical data from previous hospitalization at [facility]. Also ordered a carotid duplex exam which I reviewed and interpreted. There is a very tight greater than 95% left ICA stenosis. The right ICA was not studied today in our office but is thought to be approximately 70%   stenotic by MR angiography     Assessment:    #1 acute left nonhemorrhagic CVA in left insular cortex-6 days ago-symptoms completely resolved after 24 hours #2 greater than 95% left ICA stenosis #3 moderate right ICA stenosis by MR angiography #4 no history coronary artery disease   Plan:    Patient needs left carotid endarterectomy. Have scheduled this for Wednesday, October 16. Have thoroughly discussed risks and benefits with patient and family including 3% risk of stroke. Patient and family would like to proceed and will schedule her for next Wednesday October 16. Patient will continue aspirin daily and if has any new neurologic symptoms will be in touch with us immediately            Electronic signature on 11/11/2011        Not recorded              Level of Service     PR OFFICE OUTPATIENT NEW 45 MINUTES  [99204]         All Flowsheet Templates (all recorded)     Encounter Vitals Flowsheet    Custom Formula Data Flowsheet    Anthropometrics Flowsheet               Referring Provider          Elka Satterfield S Parsons, MD       All Charges for This Encounter       Code Description Service Date Service Provider Modifiers Quantity    99204 PR OFFICE OUTPATIENT NEW 45 MINUTES 11/11/2011 Amariz Flamenco D Deetra Booton, MD   1    G8599 PR NO ASP THERP USED 11/11/2011 Oseas Detty D Wilna Pennie, MD   1    1036F PR CURRENT TOBACCO NON-USER 11/11/2011 Glendi Mohiuddin D Kanton Kamel, MD   1        Other Encounter Related Information     Allergies & Medications         Problem List         History         Patient-Entered Questionnaires   Printed AVS Reports     No AVS reports have been printed for this encounter.        No data filed         

## 2011-12-12 NOTE — Transfer of Care (Signed)
Immediate Anesthesia Transfer of Care Note  Patient: Megan Davis  Procedure(s) Performed: Procedure(s) (LRB) with comments: ENDARTERECTOMY CAROTID (Left) PATCH ANGIOPLASTY (Left) - with dacron patch angioplasty  Patient Location: PACU  Anesthesia Type:General  Level of Consciousness: awake, alert  and oriented  Airway & Oxygen Therapy: Patient Spontanous Breathing and Patient connected to nasal cannula oxygen  Post-op Assessment: Report given to PACU RN and Patient moving all extremities X 4  Post vital signs: Reviewed and stable  Complications: No apparent anesthesia complications

## 2011-12-12 NOTE — Anesthesia Preprocedure Evaluation (Addendum)
Anesthesia Evaluation  Patient identified by MRN, date of birth, ID band Patient awake    Reviewed: Allergy & Precautions, H&P , NPO status , Patient's Chart, lab work & pertinent test results, reviewed documented beta blocker date and time   Airway Mallampati: II TM Distance: >3 FB Neck ROM: full    Dental  (+) Edentulous Upper, Edentulous Lower and Dental Advisory Given   Pulmonary neg pulmonary ROS,  breath sounds clear to auscultation        Cardiovascular + Valvular Problems/Murmurs AS Rhythm:regular + Systolic murmurs    Neuro/Psych CVA, No Residual Symptoms negative psych ROS   GI/Hepatic negative GI ROS, Neg liver ROS,   Endo/Other  negative endocrine ROS  Renal/GU negative Renal ROS  negative genitourinary   Musculoskeletal   Abdominal   Peds  Hematology negative hematology ROS (+)   Anesthesia Other Findings See surgeon's H&P   Reproductive/Obstetrics negative OB ROS                          Anesthesia Physical Anesthesia Plan  ASA: III  Anesthesia Plan: General   Post-op Pain Management:    Induction: Intravenous  Airway Management Planned: Oral ETT  Additional Equipment: Arterial line  Intra-op Plan:   Post-operative Plan: Extubation in OR  Informed Consent: I have reviewed the patients History and Physical, chart, labs and discussed the procedure including the risks, benefits and alternatives for the proposed anesthesia with the patient or authorized representative who has indicated his/her understanding and acceptance.   Dental Advisory Given  Plan Discussed with: CRNA and Surgeon  Anesthesia Plan Comments:         Anesthesia Quick Evaluation

## 2011-12-12 NOTE — Interval H&P Note (Signed)
History and Physical Interval Note:  12/12/2011 7:30 AM  Megan Davis  has presented today for surgery, with the diagnosis of LEFT ICA STENOSIS  The various methods of treatment have been discussed with the patient and family. After consideration of risks, benefits and other options for treatment, the patient has consented to  Procedure(s) (LRB) with comments: ENDARTERECTOMY CAROTID (Left) as a surgical intervention .  The patient's history has been reviewed, patient examined, no change in status, stable for surgery.  I have reviewed the patient's chart and labs.  Questions were answered to the patient's satisfaction.     [name]

## 2011-12-12 NOTE — Anesthesia Procedure Notes (Signed)
Procedure Name: Intubation Date/Time: 12/12/2011 7:55 AM Performed by: Jefm Miles E Pre-anesthesia Checklist: Patient identified, Timeout performed, Emergency Drugs available, Suction available and Patient being monitored Patient Re-evaluated:Patient Re-evaluated prior to inductionOxygen Delivery Method: Circle system utilized Preoxygenation: Pre-oxygenation with 100% oxygen Intubation Type: IV induction Ventilation: Mask ventilation without difficulty Laryngoscope Size: Mac and 3 Grade View: Grade I Tube size: 7.0 mm Number of attempts: 1 Airway Equipment and Method: Stylet Placement Confirmation: ETT inserted through vocal cords under direct vision,  breath sounds checked- equal and bilateral and positive ETCO2 Secured at: 21 cm Tube secured with: Tape Dental Injury: Teeth and Oropharynx as per pre-operative assessment

## 2011-12-12 NOTE — Progress Notes (Signed)
Utilization review completed.  

## 2011-12-12 NOTE — H&P (View-Only) (Signed)
Patient ID: Geniel Borelli, female   DOB: 15-Aug-1922, 78 y.o.   MRN: [id number]   Airianna Eid   11/11/2011 2:00 PM Office Visit  MRN: [id number]   Description: 78 year old female  Provider: Pryor Ochoa, MD  Department: [facility]        Diagnoses     Occlusion and stenosis of carotid artery with cerebral infarction   - Primary    433.11      Reason for Visit     Carotid    NEW CAROTID        Vitals - Last Recorded       BP Pulse Resp Ht Wt BMI    146/75 49 18 5\' 4"  (1.626 m) 104 lb (47.174 kg) 17.85 kg/m2       Progress Notes     [name], MD  11/11/2011  3:11 PM  SignedSubjective:      Patient ID: Charlton Amor, female   DOB: [birth year], 78 y.o.   MRN: [id number]   HPI this 78 year old female is referred by Dr. Elby Beck for severe carotid occlusive disease and a recent left brain CVA. 6 days ago this patient developed right hemiparesis and was found to have a small left brain CVA by MRI scanning. She also was found to have a very tight left ICA stenosis by MR angiography. The patient regained normal neurologic function within 24 hours and was eventually discharged from the hospital and referred for possible carotid surgery. She was seen to have an acute nonhemorrhagic infarct in the posterior left insular cortex.     Past Medical History   Diagnosis  Date   .  Carotid artery occlusion         History   Substance Use Topics   .  Smoking status:  Never Smoker    .  Smokeless tobacco:  Never Used   .  Alcohol Use:  No      No family history on file.   No Known Allergies   Current outpatient prescriptions:calcium-vitamin D (OSCAL) 250-125 MG-UNIT per tablet, Take 1 tablet by mouth daily., Disp: , Rfl: ;  Multiple Vitamin (MULTIVITAMIN) tablet, Take 1 tablet by mouth daily., Disp: , Rfl:    BP 146/75  Pulse 49  Resp 18  Ht 5\' 4"  (1.626 m)  Wt 104 lb (47.174 kg)  BMI 17.85 kg/m2   Body mass index is 17.85  kg/(m^2).                   Review of Systems complains of occasional dizziness but denies chest pain, dyspnea on exertion, PND, orthopnea, hemoptysis. Patient is frail but does ambulate significant amounts at home. Other systems negative and complete review of systems   Objective:    Physical Exam blood pressure 146/75 heart rate 49 respirations 18 Gen.-alert and oriented x3 in no apparent distress HEENT normal for age Lungs no rhonchi or wheezing Cardiovascular regular rhythm no murmurs carotid pulses 3+ palpable no bruits audible Abdomen soft nontender no palpable masses Musculoskeletal free of  major deformities Skin clear -no rashes Neurologic normal Lower extremities 3+ femoral  palpable bilaterally with no edema-both feet well perfused   Today I reviewed the clinical data from previous hospitalization at [facility] Also ordered a carotid duplex exam which I reviewed and interpreted. There is a very tight greater than 95% left ICA stenosis. The right ICA was not studied today in our office but is thought to be approximately 70%  stenotic by MR angiography     Assessment:    #1 acute left nonhemorrhagic CVA in left insular cortex-6 days ago-symptoms completely resolved after 24 hours #2 greater than 95% left ICA stenosis #3 moderate right ICA stenosis by MR angiography #4 no history coronary artery disease   Plan:    Patient needs left carotid endarterectomy. Have scheduled this for Wednesday, October 16. Have thoroughly discussed risks and benefits with patient and family including 3% risk of stroke. Patient and family would like to proceed and will schedule her for next Wednesday October 16. Patient will continue aspirin daily and if has any new neurologic symptoms will be in touch with [place] immediately            Electronic signature on 11/11/2011        Not recorded              Level of Service     PR OFFICE OUTPATIENT NEW 45 MINUTES  [[id number]]         All Flowsheet Templates (all recorded)     Encounter Vitals Flowsheet    Custom Formula Data Flowsheet    Anthropometrics Flowsheet               Referring Provider          Norval Morton, MD       All Charges for This Encounter       Code Description Service Date Service Provider Modifiers Quantity    [phone] PR OFFICE OUTPATIENT NEW 45 MINUTES 11/11/2011 Pryor Ochoa, MD   1    [phone] PR NO ASP THERP USED 11/11/2011 Pryor Ochoa, MD   1    [phone] PR CURRENT TOBACCO NON-USER 11/11/2011 Pryor Ochoa, MD   1        Other Encounter Related Information     Allergies & Medications         Problem List         History         Patient-Entered Questionnaires   Printed AVS Reports     No AVS reports have been printed for this encounter.        No data filed

## 2011-12-12 NOTE — Telephone Encounter (Signed)
Message copied by Margaretmary Eddy on Thu Dec 12, 2011  3:38 PM ------      Message from: Lorin Mercy K      Created: Thu Dec 12, 2011  9:19 AM      Regarding: schedule                   ----- Message -----         From: Dara Lords, PA         Sent: 12/12/2011   9:06 AM           To: Sharee Pimple, CMA            S/p left CEA by JDL.  F/u with him in 2 weeks.            Thanks,      Lelon Mast

## 2011-12-13 ENCOUNTER — Encounter (HOSPITAL_COMMUNITY): Payer: Self-pay | Admitting: Vascular Surgery

## 2011-12-13 LAB — CBC
HCT: 29.7 % — ABNORMAL LOW (ref 36.0–46.0)
MCHC: 31.6 g/dL (ref 30.0–36.0)
MCV: 89.7 fL (ref 78.0–100.0)
Platelets: 151 10*3/uL (ref 150–400)
RDW: 12.4 % (ref 11.5–15.5)

## 2011-12-13 LAB — BASIC METABOLIC PANEL
BUN: 33 mg/dL — ABNORMAL HIGH (ref 6–23)
Calcium: 9.6 mg/dL (ref 8.4–10.5)
Creatinine, Ser: 1.5 mg/dL — ABNORMAL HIGH (ref 0.50–1.10)
GFR calc Af Amer: 35 mL/min — ABNORMAL LOW (ref >90)
GFR calc non Af Amer: 30 mL/min — ABNORMAL LOW (ref >90)

## 2011-12-13 MED ORDER — OXYCODONE HCL 5 MG PO TABS: 5.0000 mg | ORAL_TABLET | Freq: Four times a day (QID) | ORAL | Status: AC | PRN

## 2011-12-13 NOTE — Discharge Summary (Signed)
[facility] Discharge Summary   Patient ID:  Megan Davis MRN: [id number] DOB/AGE: 1922-12-10 78 y.o.  Admit date: 12/12/2011 Discharge date: 12/13/2011 Date of Surgery: 12/12/2011 Surgeon: Surgeon(s): Pryor Ochoa, MD  Admission Diagnosis: LEFT ICA STENOSIS  Discharge Diagnoses:  LEFT ICA STENOSIS  Secondary Diagnoses: Past Medical History  Diagnosis Date  . Carotid artery occlusion   . Stroke   . Incontinence overflow, stress female     Procedure(s): ENDARTERECTOMY CAROTID PATCH ANGIOPLASTY  Discharged Condition: good  HPI: Megan Davis has presented today for surgery, with the diagnosis of LEFT ICA STENOSIS The various methods of treatment have been discussed with the patient and family. After consideration of risks, benefits and other options for treatment, the patient has consented to Procedure(s) (LRB) with comments:  ENDARTERECTOMY CAROTID (Left) as a surgical intervention . The patient's history has been reviewed, patient examined, no change in status, stable for surgery. I have reviewed the patient's chart and labs. Questions were answered to the patient's satisfaction.       Hospital Course:  Megan Davis is a 78 y.o. female is S/P Left Procedure(s): ENDARTERECTOMY CAROTID PATCH ANGIOPLASTY Extubated: POD # 0 Post-op wounds clean, dry, intact or healing well Pt. Ambulating, voiding and taking PO diet without difficulty. Pt pain controlled with PO pain meds. Labs as below Complications:none  Consults:     Significant Diagnostic Studies: CBC Lab Results  Component Value Date   WBC 8.5 12/13/2011   HGB 9.4* 12/13/2011   HCT 29.7* 12/13/2011   MCV 89.7 12/13/2011   PLT 151 12/13/2011    BMET    Component Value Date/Time   NA 140 12/13/2011 0500   K 4.4 12/13/2011 0500   CL 106 12/13/2011 0500   CO2 28 12/13/2011 0500   GLUCOSE 94 12/13/2011 0500   BUN 33* 12/13/2011 0500   CREATININE 1.50* 12/13/2011 0500   CALCIUM 9.6 12/13/2011 0500   GFRNONAA 30* 12/13/2011 0500   GFRAA 35* 12/13/2011 0500   COAG Lab Results  Component Value Date   INR 1.07 12/12/2011   INR 1.11 11/15/2011     Disposition:  Discharge to :Home Discharge Orders    Future Appointments: Provider: Department: Dept Phone: Center:   12/31/2011 11:45 AM Pryor Ochoa, MD [facility] [phone] [facility]     Future Orders Please Complete By Expires   Resume previous diet      Driving Restrictions      Comments:   No driving for 2 weeks   Lifting restrictions      Comments:   No lifting for 6 weeks   Call MD for:  temperature >100.5      Call MD for:  redness, tenderness, or signs of infection (pain, swelling, bleeding, redness, odor or green/yellow discharge around incision site)      Call MD for:  severe or increased pain, loss or decreased feeling  in affected limb(s)      Discharge wound care:      Comments:   Shower daily with soap and water starting 12/14/11   CAROTID Sugery: Call MD for difficulty swallowing or speaking; weakness in arms or legs that is a new symtom; severe headache.  If you have increased swelling in the neck and/or  are having difficulty breathing, CALL 911         Megan, Davis [facility] Medication Instructions [id number]   Printed on:12/13/11 0735  Medication Information  calcium-vitamin D (OSCAL) 250-125 MG-UNIT per tablet Take 1 tablet by mouth daily.           Multiple Vitamin (MULTIVITAMIN) tablet Take 1 tablet by mouth daily.           amLODipine (NORVASC) 2.5 MG tablet Take 2.5 mg by mouth daily.           oxyCODONE (ROXICODONE) 5 MG immediate release tablet Take 1 tablet (5 mg total) by mouth every 6 (six) hours as needed for pain.            Verbal and written Discharge instructions given to the patient. Wound care per Discharge AVS Follow-up Information    Follow up with [name], MD. In 2 weeks. (Office will call you  to arrange your appt (sent))    Contact information:   [address] [place] [address] [phone]          Signed: Clinton Gallant [facility] 12/13/2011, 7:35 AM

## 2011-12-13 NOTE — Progress Notes (Addendum)
[facility] - OP [facility] PROGRESS NOTE  Date of Surgery: 12/12/2011 Surgeon: Surgeon(s): Pryor Ochoa, MD 1 Day Post-Op left Carotid Endarterectomy .  HPI: Megan Davis is a 78 y.o. female who is 1 Day Post-Op left Carotid Endarterectomy . Patient is doing well. Pre-operative symptoms are Improved Patient denies headache; Patient denies difficulty swallowing; denies weakness in upper or lower extremities; Pt. denies other symptoms of stroke or TIA.  IMAGING: No results found.  Significant Diagnostic Studies: CBC Lab Results  Component Value Date   WBC 8.5 12/13/2011   HGB 9.4* 12/13/2011   HCT 29.7* 12/13/2011   MCV 89.7 12/13/2011   PLT 151 12/13/2011    BMET    Component Value Date/Time   NA 140 12/13/2011 0500   K 4.4 12/13/2011 0500   CL 106 12/13/2011 0500   CO2 28 12/13/2011 0500   GLUCOSE 94 12/13/2011 0500   BUN 33* 12/13/2011 0500   CREATININE 1.50* 12/13/2011 0500   CALCIUM 9.6 12/13/2011 0500   GFRNONAA 30* 12/13/2011 0500   GFRAA 35* 12/13/2011 0500    COAG Lab Results  Component Value Date   INR 1.07 12/12/2011   INR 1.11 11/15/2011   No results found for this basename: PTT      Intake/Output Summary (Last 24 hours) at 12/13/11 0730 Last data filed at 12/13/11 0000  Gross per 24 hour  Intake   1250 ml  Output   1325 ml  Net    -75 ml    Physical Exam:  BP Readings from Last 3 Encounters:  12/13/11 114/62  12/13/11 114/62  11/15/11 135/63   Temp Readings from Last 3 Encounters:  12/13/11 98.4 F (36.9 C) Oral  12/13/11 98.4 F (36.9 C) Oral  11/15/11 98.1 F (36.7 C) Oral   SpO2 Readings from Last 3 Encounters:  12/13/11 96%  12/13/11 96%  11/15/11 97%   Pulse Readings from Last 3 Encounters:  12/13/11 60  12/13/11 60  11/15/11 63    Pt is A&O x 3 Gait is normal Speech is fluent left Neck Wound is healing well Patient with Negative tongue deviation and Negative facial droop Pt has good and equal strength in  all extremities  Assessment: Megan Davis is a 78 y.o. female is S/P Left Carotid endarterectomy Pt is voiding, ambulating and taking po well   Plan: Discharge to: Home Follow-up in 2 weeks   Clinton Gallant [facility] [phone] 12/13/2011 7:30 AM   Agree with above Left neck incision looks good with no hematoma Neurologic exam unremarkable The patient denies nausea   Plan DC Foley and ambulate patient and discharge home later today

## 2011-12-13 NOTE — Progress Notes (Signed)
Patient d/c'd home per MD order. [facility] and neuro intact. Patient and family given d/c instructions/prescriptions and all questions answered. Patient d/c'd via wheelchair with volunteer and family.

## 2011-12-30 ENCOUNTER — Encounter: Payer: Self-pay | Admitting: Vascular Surgery

## 2011-12-31 ENCOUNTER — Encounter: Payer: Self-pay | Admitting: Vascular Surgery

## 2011-12-31 ENCOUNTER — Ambulatory Visit (INDEPENDENT_AMBULATORY_CARE_PROVIDER_SITE_OTHER): Payer: Medicare PPO | Admitting: Vascular Surgery

## 2011-12-31 VITALS — BP 140/51 | HR 64 | Temp 98.3°F | Ht 65.0 in | Wt 118.0 lb

## 2011-12-31 DIAGNOSIS — I63239 Cerebral infarction due to unspecified occlusion or stenosis of unspecified carotid arteries: Secondary | ICD-10-CM

## 2011-12-31 HISTORY — PX: CAROTID ENDARTERECTOMY: SUR193

## 2011-12-31 NOTE — Progress Notes (Signed)
Subjective:     Patient ID: Megan Davis, female   DOB: 01-05-1923, 78 y.o.   MRN: [id number]  HPI this 78 year old female returns 2 weeks post left carotid endarterectomy for severe left internal carotid stenosis. She has done well since her surgery. She denies any hoarseness or new neurologic symptoms such as lateralizing weakness, aphasia, or amaurosis fugax. She is swallowing well. She has no specific complaints. She does not take aspirin on a daily basis.   Review of Systems     Objective:   Physical ExamBP 140/51  Pulse 64  Temp 98.3 F (36.8 C) (Oral)  Ht 5\' 5"  (1.651 m)  Wt 118 lb (53.524 kg)  BMI 19.64 kg/m2  SpO2 97%  Gen. elderly female in no apparent distress alert and oriented x3 Left neck incision has healed nicely with no evidence of swelling or infection. Carotid pulses 3+ bilaterally with no audible bruits Neurologic exam normal    Assessment:     Doing well post left carotid endarterectomy    Plan:     #1 would recommend aspirin 81 mg by mouth daily #2 return in 6 months for followup carotid duplex exam and to see me at that time

## 2012-02-24 ENCOUNTER — Other Ambulatory Visit: Payer: Self-pay | Admitting: *Deleted

## 2012-02-24 DIAGNOSIS — I6529 Occlusion and stenosis of unspecified carotid artery: Secondary | ICD-10-CM

## 2012-02-24 DIAGNOSIS — Z48812 Encounter for surgical aftercare following surgery on the circulatory system: Secondary | ICD-10-CM

## 2012-06-16 ENCOUNTER — Telehealth: Payer: Self-pay | Admitting: Cardiovascular Disease

## 2012-06-16 NOTE — Telephone Encounter (Signed)
Returned call and spoke w/ Steele Berg, pt's granddaughter/caregiver.  Wanted to know time for pt's appt on 5.27.14.  Informed of appt date/time andgiven phone number to Dr. Candie Chroman office.

## 2012-06-16 NOTE — Telephone Encounter (Signed)
Paper chart requested.

## 2012-06-16 NOTE — Telephone Encounter (Signed)
Returning call from yesterday-she doesn't know wh called!

## 2012-06-25 ENCOUNTER — Encounter: Payer: Self-pay | Admitting: Vascular Surgery

## 2012-06-26 ENCOUNTER — Ambulatory Visit: Payer: Medicare PPO | Admitting: Vascular Surgery

## 2012-06-26 ENCOUNTER — Other Ambulatory Visit: Payer: Medicare PPO

## 2012-06-30 ENCOUNTER — Other Ambulatory Visit: Payer: Medicare PPO

## 2012-06-30 ENCOUNTER — Ambulatory Visit: Payer: Medicare PPO | Admitting: Vascular Surgery

## 2012-08-10 ENCOUNTER — Encounter: Payer: Self-pay | Admitting: Vascular Surgery

## 2012-08-11 ENCOUNTER — Other Ambulatory Visit (INDEPENDENT_AMBULATORY_CARE_PROVIDER_SITE_OTHER): Payer: Medicare PPO | Admitting: *Deleted

## 2012-08-11 ENCOUNTER — Ambulatory Visit (INDEPENDENT_AMBULATORY_CARE_PROVIDER_SITE_OTHER): Payer: Medicare PPO | Admitting: Vascular Surgery

## 2012-08-11 ENCOUNTER — Encounter: Payer: Self-pay | Admitting: Vascular Surgery

## 2012-08-11 DIAGNOSIS — I6529 Occlusion and stenosis of unspecified carotid artery: Secondary | ICD-10-CM

## 2012-08-11 DIAGNOSIS — Z48812 Encounter for surgical aftercare following surgery on the circulatory system: Secondary | ICD-10-CM

## 2012-08-11 NOTE — Addendum Note (Signed)
Addended by: Adria Dill L on: 08/11/2012 02:52 PM   Modules accepted: Orders

## 2012-08-11 NOTE — Progress Notes (Signed)
Subjective:     Patient ID: Megan Davis, female   DOB: 1922/10/21, 79 y.o.   MRN: [id number]  HPI this 79 year old female returns 7 months post left carotid endarterectomy for severe asymptomatic left internal carotid stenosis. She has had no neurologic complications. She denies any lateralizing weakness, aphasia, amaurosis fugax, diplopia, blurred vision, or syncope. She is swallowing well has had no hoarseness. She has no specific complaints.  Past Medical History  Diagnosis Date  . Carotid artery occlusion   . Stroke   . Incontinence overflow, stress female   . Atrial fibrillation     History  Substance Use Topics  . Smoking status: Never Smoker   . Smokeless tobacco: Never Used  . Alcohol Use: No    History reviewed. No pertinent family history.  Allergies  Allergen Reactions  . Chocolate     Current outpatient prescriptions:amLODipine (NORVASC) 2.5 MG tablet, Take 2.5 mg by mouth daily., Disp: , Rfl: ;  calcium-vitamin D (OSCAL) 250-125 MG-UNIT per tablet, Take 1 tablet by mouth daily., Disp: , Rfl: ;  Multiple Vitamin (MULTIVITAMIN) tablet, Take 1 tablet by mouth daily., Disp: , Rfl:  oxyCODONE (ROXICODONE) 5 MG immediate release tablet, Take 1 tablet (5 mg total) by mouth every 6 (six) hours as needed for pain., Disp: 30 tablet, Rfl: 0  BP 160/66  Pulse 62  Ht 5\' 5"  (1.651 m)  Wt 117 lb 9.6 oz (53.343 kg)  BMI 19.57 kg/m2  SpO2 100%  Body mass index is 19.57 kg/(m^2).           Review of Systems denies chest pain, dyspnea on exertion, PND, orthopnea, hemoptysis. All systems negative complete review of systems     Objective:   Physical Exam blood pressure 160/66 heart rate 62 respirations 18 Gen.-alert and oriented x3 in no apparent distress HEENT normal for age Lungs no rhonchi or wheezing Cardiovascular regular rhythm no murmurs carotid pulses 3+ palpable no bruits audible Abdomen soft nontender no palpable masses Musculoskeletal free of  major  deformities Skin clear -no rashes Neurologic normal Lower extremities 3+ femoral in both lower extremities well perfused  Data ordered carotid duplex exam which are reviewed and interpreted. The right ICA is widely patent. Left carotid endarterectomy site does have some elevation of velocities consistent with a 50% stenosis.      Assessment:     Doing well 7 months post left carotid endarterectomy with mild restenosis left carotid endarterectomy site-asymptomatic    Plan:     Return in 6 months with followup carotid duplex exam unless patient develops any symptoms in the interim

## 2013-02-16 ENCOUNTER — Other Ambulatory Visit (HOSPITAL_COMMUNITY): Payer: Medicare PPO

## 2013-02-16 ENCOUNTER — Ambulatory Visit: Payer: Medicare PPO | Admitting: Vascular Surgery

## 2013-03-08 ENCOUNTER — Encounter: Payer: Self-pay | Admitting: Family

## 2013-03-09 ENCOUNTER — Encounter: Payer: Self-pay | Admitting: Family

## 2013-03-09 ENCOUNTER — Ambulatory Visit (HOSPITAL_COMMUNITY)
Admission: RE | Admit: 2013-03-09 | Discharge: 2013-03-09 | Disposition: A | Discharge status: Home / Self Care | Payer: Medicare PPO | Source: Ambulatory Visit | Attending: Family | Admitting: Family

## 2013-03-09 ENCOUNTER — Ambulatory Visit (INDEPENDENT_AMBULATORY_CARE_PROVIDER_SITE_OTHER): Payer: Medicare PPO | Admitting: Family

## 2013-03-09 VITALS — BP 142/68 | HR 52 | Resp 16 | Ht 64.0 in | Wt 114.0 lb

## 2013-03-09 DIAGNOSIS — I6529 Occlusion and stenosis of unspecified carotid artery: Secondary | ICD-10-CM

## 2013-03-09 DIAGNOSIS — Z48812 Encounter for surgical aftercare following surgery on the circulatory system: Secondary | ICD-10-CM

## 2013-03-09 NOTE — Progress Notes (Signed)
Established Carotid Patient   History of Present Illness  Megan Davis is a 80 y.o. female patient of Dr. Hart RochesterLawson who is status post left carotid endarterectomy on 12/31/11 for severe asymptomatic left internal carotid stenosis. Had a TIA right before the CEA as manifested by slurred speech, dizziness which lasted less than a day.  Patient has Negative history of TIA or stroke symptom.  The patient denies amaurosis fugax or monocular blindness.  The patient  denies facial drooping.  Pt. denies hemiplegia.  The patient denies receptive or expressive aphasia.     Pt denies New Medical or Surgical History.  Pt Diabetic: No Pt smoker: non-smoker  Pt meds include: Statin : No ASA: No, does not why she stopped Other anticoagulants/antiplatelets: no   Past Medical History  Diagnosis Date  . Carotid artery occlusion   . Stroke   . Incontinence overflow, stress female   . Atrial fibrillation     Social History History  Substance Use Topics  . Smoking status: Never Smoker   . Smokeless tobacco: Never Used  . Alcohol Use: No    Family History Family History  Problem Relation Age of Onset  . Hypertension Brother     Surgical History Past Surgical History  Procedure Laterality Date  . Cataract extraction    . Endarterectomy  12/12/2011    Procedure: ENDARTERECTOMY CAROTID;  Surgeon: Pryor OchoaJames D Lawson, MD;  [facility];  Service: Vascular;  Laterality: Left;  . Patch angioplasty  12/12/2011    Procedure: PATCH ANGIOPLASTY;  Surgeon: Pryor OchoaJames D Lawson, MD;  [facility];  Service: Vascular;  Laterality: Left;  with dacron patch angioplasty  . Carotid endarterectomy Left 12-31-11    cea  . Eye surgery      Allergies  Allergen Reactions  . Chocolate     Current Outpatient Prescriptions  Medication Sig Dispense Refill  . amLODipine (NORVASC) 2.5 MG tablet Take 2.5 mg by mouth daily.      . calcium-vitamin D (OSCAL) 250-125 MG-UNIT per tablet Take 1 tablet by mouth  daily.      . Multiple Vitamin (MULTIVITAMIN) tablet Take 1 tablet by mouth daily.      Marland Kitchen. oxyCODONE (ROXICODONE) 5 MG immediate release tablet Take 1 tablet (5 mg total) by mouth every 6 (six) hours as needed for pain.  30 tablet  0   No current facility-administered medications for this visit.    Review of Systems : See HPI for pertinent positives and negatives.  Physical Examination  Filed Vitals:   03/09/13 1411  BP: 142/68  Pulse: 52  Resp: 16   Filed Weights   03/09/13 1411  Weight: 114 lb (51.71 kg)   Body mass index is 19.56 kg/(m^2).  General: WDWN female in NAD GAIT: normal Eyes: PERRLA Pulmonary:  CTAB, Negative  Rales, Negative rhonchi, & Negative wheezing.  Cardiac: regular Rhythm ,  Negative Murmurs.  VASCULAR EXAM Carotid Bruits Left Right   Negative Negative    Aorta is not palpable. Radial pulses are 2+ palpable and equal.  LE Pulses LEFT RIGHT       POPLITEAL  not palpable   not palpable    Gastrointestinal: soft, nontender, BS WNL, no r/g,  negative masses.  Musculoskeletal: Negative muscle atrophy/wasting. M/S 4/5 in UE's, 3/5 in LE's, Extremities without ischemic changes.  Neurologic: A&O X 3; Appropriate Affect ; SENSATION ;normal;  Speech is soft, speaks only when asked questions. CN 2-12 intact  Except is slightly hard of hearing, Pain and light touch intact in extremities, Motor exam as listed above.   Non-Invasive Vascular Imaging CAROTID DUPLEX 03/09/2013   CEREBROVASCULAR DUPLEX EVALUATION    INDICATION: Follow-up carotid disease     PREVIOUS INTERVENTION(S): Left carotid endarterectomy 12/31/2011    DUPLEX EXAM:     RIGHT  LEFT  Peak Systolic Velocities (cm/s) End Diastolic Velocities (cm/s) Plaque LOCATION Peak Systolic Velocities (cm/s) End Diastolic Velocities (cm/s) Plaque  135 12  CCA PROXIMAL 81 20    77 15  CCA MID 127 19   133 17 HT CCA DISTAL 86 19   138 8 HT ECA 128 8   118 17 HT ICA PROXIMAL 134 30   120 21  ICA MID 141 31 HT  105 24  ICA DISTAL 130 40     .89 ICA / CCA Ratio (PSV) NA  Antegrade  Vertebral Flow Antegrade   126 Brachial Systolic Pressure (mmHg) 140  Within normal limits  Brachial Artery Waveforms Within normal limits     Plaque Morphology:  HM = Homogeneous, HT = Heterogeneous, CP = Calcific Plaque, SP = Smooth Plaque, IP = Irregular Plaque     ADDITIONAL FINDINGS: Tortuosity is observed bilaterally.    IMPRESSION: 1. Evidence of <40% stenosis of the right internal carotid artery. 2. Patent left carotid endarterectomy with mild (<40%) disease which may be a suture at the surgical end point. 3. Bilateral vertebral artery is antegrade.    Compared to the previous exam:  No significant change compared to prior exam.       Assessment: Megan Davis is a 80 y.o. female who presents  status post left carotid endarterectomy on 12/31/11 with asymptomatic minimal bilateral ICA stenosis. The  ICA stenosis is  Unchanged from previous exam.   Plan: Suggest daily 81 mg ASA to reduce her risk of stroke, unless contraindication exists, and discussed this with patient and her son who is present. Follow-up in 1 year with Carotid Duplex scan.   I discussed in depth with the patient the nature of atherosclerosis, and emphasized the importance of maximal medical management including strict control of blood pressure, blood glucose, and lipid levels, obtaining regular exercise, and continued cessation of smoking.  The patient is aware that without maximal medical management the underlying atherosclerotic disease process will progress, limiting the benefit of any interventions. The patient was given information about stroke prevention and what symptoms should prompt the patient to seek immediate medical care. Thank you for allowing [place] to participate in this patient's  care.  Charisse March, RN, MSN, FNP-C [facility] Office: [phone]  Clinic Physician:   03/09/2013 2:22 PM

## 2013-03-09 NOTE — Patient Instructions (Signed)

## 2014-03-15 ENCOUNTER — Other Ambulatory Visit (HOSPITAL_COMMUNITY): Payer: Medicare PPO

## 2014-03-15 ENCOUNTER — Ambulatory Visit: Payer: Medicare PPO | Admitting: Family

## 2014-04-11 ENCOUNTER — Other Ambulatory Visit (HOSPITAL_COMMUNITY): Payer: Medicare PPO

## 2014-04-11 ENCOUNTER — Ambulatory Visit: Payer: Medicare PPO | Admitting: Family

## 2014-05-09 IMAGING — CR DG CHEST 2V
2 series · 2 of 2 positions shown · non-contrast
Comparison: None.

CLINICAL DATA: Preoperative radiograph.  Carotid endarterectomy.
Left carotid artery occlusion.

CHEST - 2 VIEW

[view not recorded (1 of 2)]
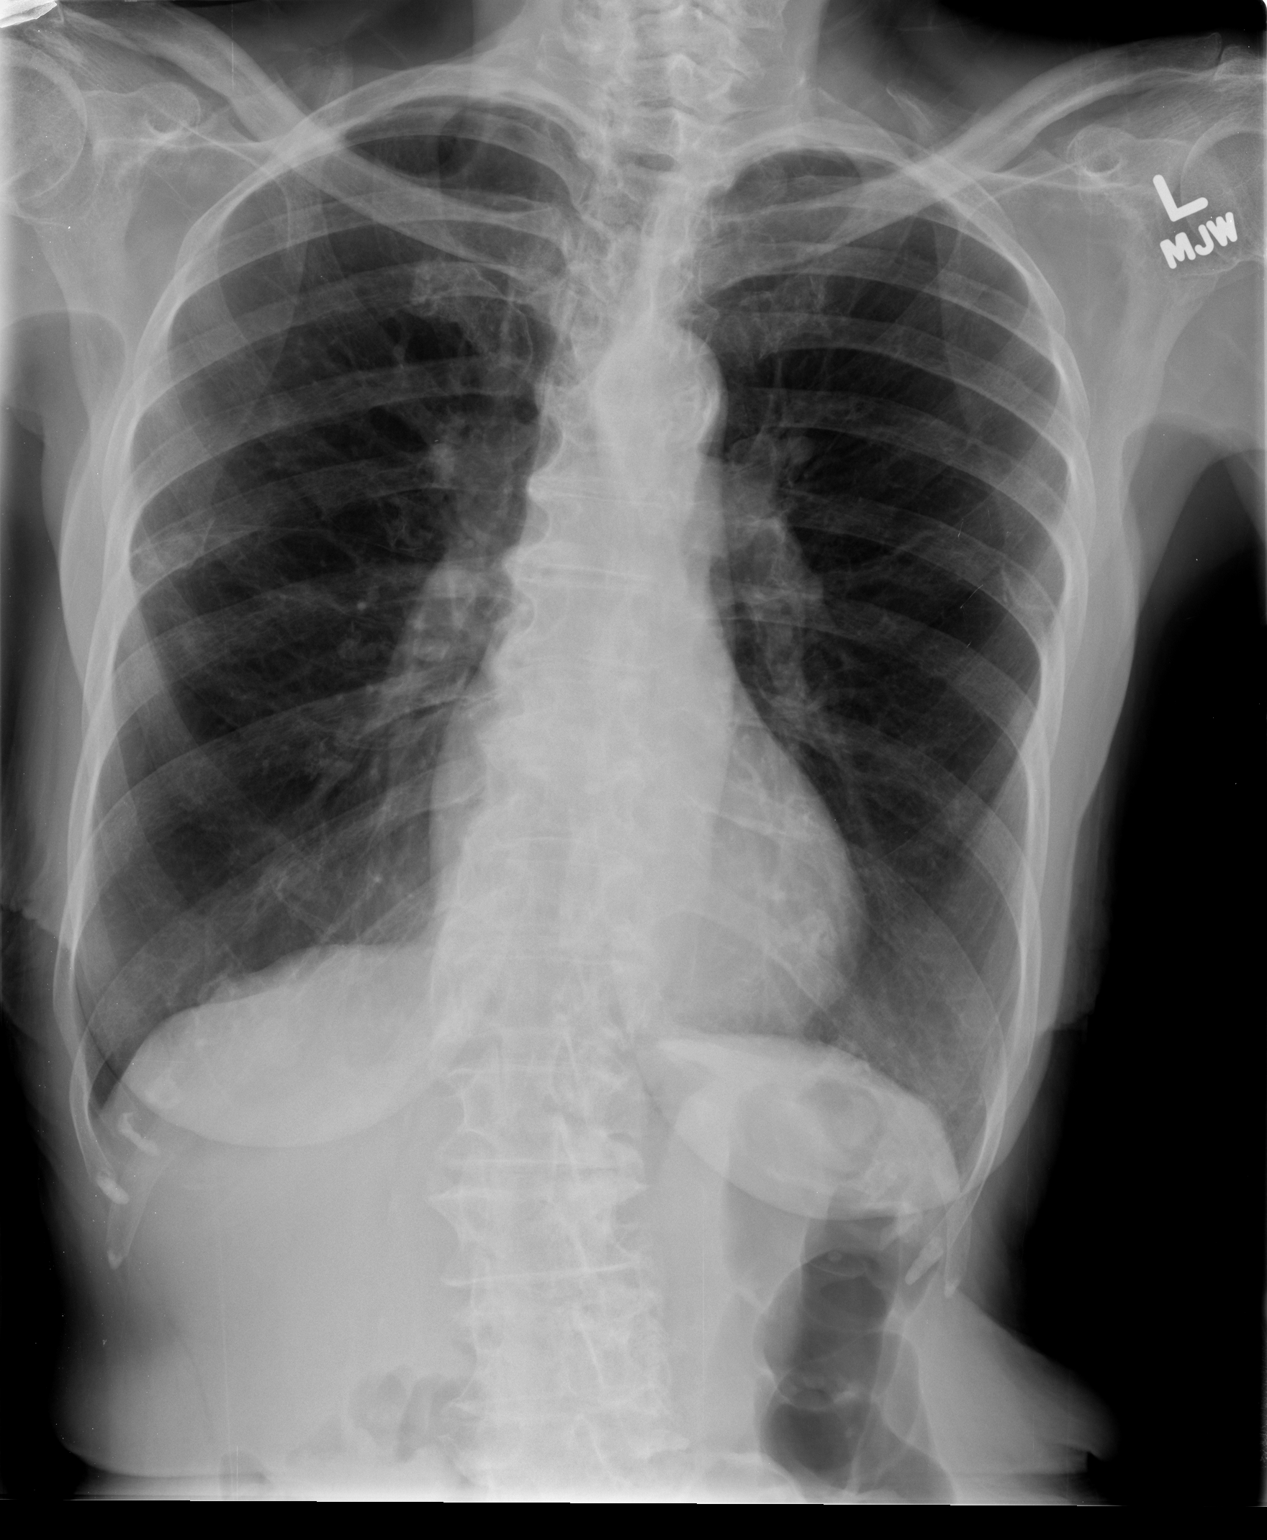

[view not recorded (2 of 2)]
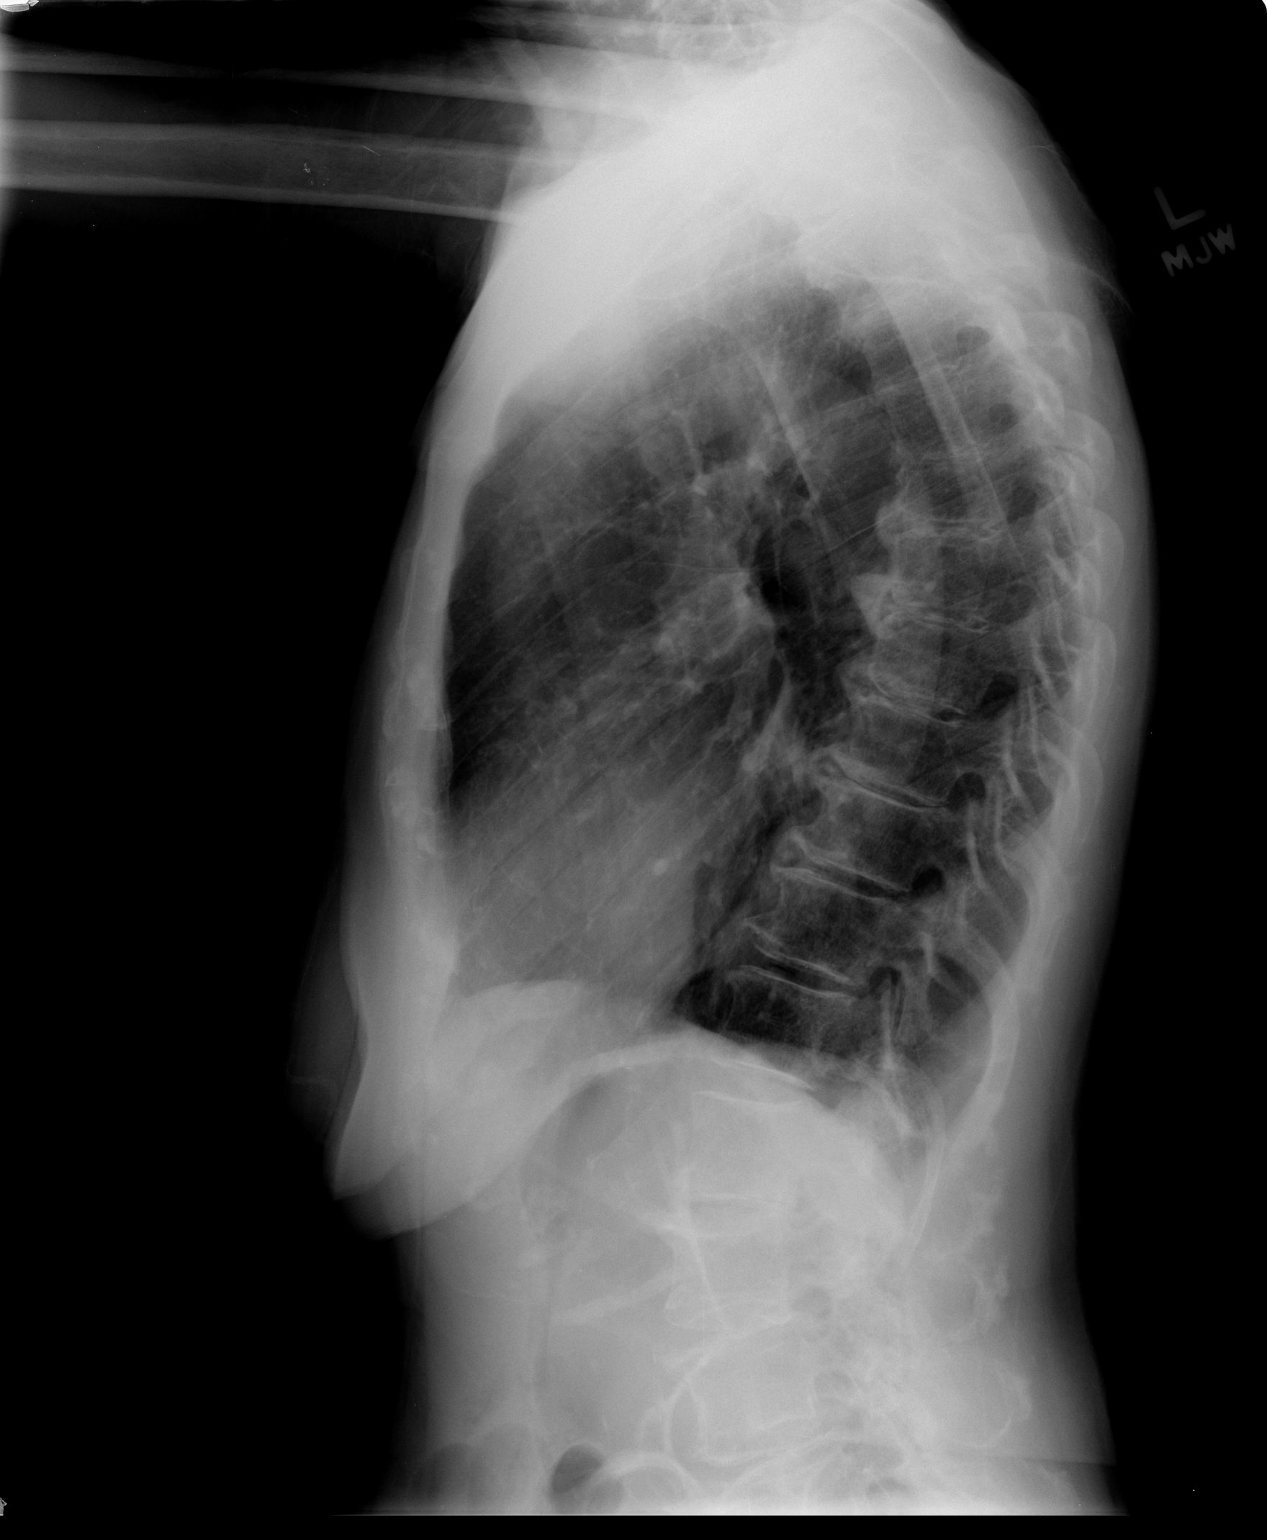

[2 of 2 positions shown; findings below may reference images not displayed]

FINDINGS: Emphysema.  There is fullness of the right hilum
increasing nodular density in the right suprahilar region.

This may be due to pulmonary arterial hypertension however the
hilar mass or adenopathy cannot be excluded.  Follow-up chest CT
recommended for further assessment.  Bilateral pleural apical
scarring.  There is no airspace disease.  No effusion.  Increased
density is present over the inferior right scapular margin, likely
representing scarring.  Thoracic spondylosis.
IMPRESSION: Right suprahilar fullness.  Adenopathy or hilar mass cannot be
excluded although the appearance could be due to pulmonary
hypertension in the setting of emphysema.

Follow-up chest CT recommended, preferably with infusion.

These results will be called to the ordering clinician or
representative by the Radiologist Assistant, and communication
documented in the PACS Dashboard.

## 2014-08-23 ENCOUNTER — Encounter: Payer: Self-pay | Admitting: *Deleted

## 2014-09-30 ENCOUNTER — Encounter: Payer: Self-pay | Admitting: Cardiovascular Disease

## 2021-07-06 ENCOUNTER — Other Ambulatory Visit: Payer: Self-pay

## 2021-07-06 ENCOUNTER — Emergency Department (HOSPITAL_COMMUNITY): Payer: Medicare Other

## 2021-07-06 ENCOUNTER — Emergency Department (HOSPITAL_COMMUNITY)
Admission: EM | Admit: 2021-07-06 | Discharge: 2021-07-06 | Disposition: A | Discharge status: Home / Self Care | Payer: Medicare Other | Attending: Emergency Medicine | Admitting: Emergency Medicine

## 2021-07-06 ENCOUNTER — Encounter (HOSPITAL_COMMUNITY): Payer: Self-pay | Admitting: *Deleted

## 2021-07-06 DIAGNOSIS — M25552 Pain in left hip: Secondary | ICD-10-CM | POA: Insufficient documentation

## 2021-07-06 DIAGNOSIS — Z79899 Other long term (current) drug therapy: Secondary | ICD-10-CM | POA: Diagnosis not present

## 2021-07-06 DIAGNOSIS — F0390 Unspecified dementia without behavioral disturbance: Secondary | ICD-10-CM | POA: Diagnosis not present

## 2021-07-06 DIAGNOSIS — R519 Headache, unspecified: Secondary | ICD-10-CM | POA: Diagnosis not present

## 2021-07-06 MED ORDER — ACETAMINOPHEN 500 MG PO TABS
500.0000 mg | ORAL_TABLET | Freq: Once | ORAL | Status: AC
  Administered 2021-07-06: 500 mg via ORAL
  Filled 2021-07-06: qty 1

## 2021-07-06 NOTE — Discharge Instructions (Addendum)
Like we discussed, the CT finding of her head was concerning for and was called a possible "normal pressure hydrocephalus".  I would recommend that she follows up with neurology regarding this.  Below is the contact information for Dr. Gerilyn Pilgrim who is a local neurologist.  Please give their office a call at your convenience and schedule an appointment for reevaluation.  Please continue to monitor her symptoms closely and bring her back to the emergency department with any new or worsening symptoms.

## 2021-07-06 NOTE — ED Triage Notes (Signed)
Pt in c/o difficulty standing new onset, per the pts daughter the pt c/o L sided pain that is not specified as hip or stomach, pt reports that her buttocks hurts, denies injury, per family the pt lives alone and uses assistive devices to ambulate but was unable to do so today, denies n/v/d

## 2021-07-06 NOTE — ED Provider Notes (Signed)
[facility] EMERGENCY DEPARTMENT Provider Note   CSN: [id number] Arrival date & time: 07/06/21  1127     History  Chief Complaint  Patient presents with   Leg Pain    Megan Davis is a 88 y.o. female.  HPI Patient is a 88 year old female with a history of dementia who presents to the emergency department with her daughter complaining of intermittent headaches as well as left-sided hip pain.  Her daughter states that over the past 2 to 3 days she has been having vague complaints of left-sided hip pain.  She has also been noting an intermittent headache.  She is A&O x1 currently and her daughter states that this is her baseline.  She states that she has been eating and drinking normally.  Otherwise has been behaving normally.  She states that she uses a wheelchair to ambulate at baseline.  Her daughter states that she lives alone and she and her brothers and sisters have cameras set up in her home and check on her throughout the day.     Home Medications Prior to Admission medications   Medication Sig Start Date End Date Taking? Authorizing Provider  amLODipine (NORVASC) 2.5 MG tablet Take 2.5 mg by mouth daily.   Yes [provider]  calcium-vitamin D (OSCAL) 250-125 MG-UNIT per tablet Take 1 tablet by mouth daily.   Yes [provider]  donepezil (ARICEPT ODT) 5 MG disintegrating tablet Take 5 mg by mouth at bedtime. 06/26/21  Yes [provider]  felodipine (PLENDIL) 5 MG 24 hr tablet Take 5 mg by mouth daily. 06/13/21  Yes [provider]  furosemide (LASIX) 20 MG tablet Take 20 mg by mouth daily as needed for fluid. 06/15/21  Yes [provider]  Multiple Vitamin (MULTIVITAMIN) tablet Take 1 tablet by mouth daily.   Yes [provider]  Omega-3 Fatty Acids (FISH OIL PO) Take 1 capsule by mouth daily.   Yes [provider]  oxyCODONE (ROXICODONE) 5 MG immediate release tablet Take 1 tablet (5 mg total) by mouth every 6  (six) hours as needed for pain. Patient not taking: Reported on 07/06/2021 12/13/11   Lars Mage, PA-C      Allergies    Chocolate    Review of Systems   Review of Systems  All other systems reviewed and are negative. Ten systems reviewed and are negative for acute change, except as noted in the HPI.   Physical Exam Updated Vital Signs BP (!) 180/60 (BP Location: Left Arm)   Pulse (!) 50   Temp 98.1 F (36.7 C) (Oral)   Resp 16   Ht 5\' 4"  (1.626 m)   Wt 47.2 kg   SpO2 99%   BMI 17.85 kg/m  Physical Exam Vitals and nursing note reviewed.  Constitutional:      General: She is not in acute distress.    Appearance: She is not ill-appearing, toxic-appearing or diaphoretic.     Comments: History of dementia.  A&O x1.  Elderly and cachectic.  Lying in the Semi-Fowler's position.  HENT:     Head: Normocephalic and atraumatic.     Right Ear: External ear normal.     Left Ear: External ear normal.     Nose: Nose normal.     Mouth/Throat:     Mouth: Mucous membranes are moist.     Pharynx: Oropharynx is clear. No oropharyngeal exudate or posterior oropharyngeal erythema.  Eyes:     General: No scleral icterus.  Right eye: No discharge.        Left eye: No discharge.     Extraocular Movements: Extraocular movements intact.     Conjunctiva/sclera: Conjunctivae normal.     Pupils: Pupils are equal, round, and reactive to light.  Neck:     Comments: No midline C, T, or L-spine tenderness. Cardiovascular:     Rate and Rhythm: Normal rate and regular rhythm.     Pulses: Normal pulses.     Heart sounds: Normal heart sounds. No murmur heard.   No friction rub. No gallop.  Pulmonary:     Effort: Pulmonary effort is normal. No respiratory distress.     Breath sounds: Normal breath sounds. No stridor. No wheezing, rhonchi or rales.  Abdominal:     General: Abdomen is flat.     Palpations: Abdomen is soft.     Tenderness: There is no abdominal tenderness.     Comments:  Abdomen is soft and nontender in all 4 quadrants.  Musculoskeletal:        General: Tenderness present. Normal range of motion.     Cervical back: Normal range of motion and neck supple. No tenderness.     Comments: Mild TTP appreciated diffusely in the left hip.  No other regions of tenderness appreciated in the pelvis or lower extremities.  Full range of motion of the bilateral hips, knees, and ankles.  Palpable pedal pulses.  Skin:    General: Skin is warm and dry.  Neurological:     General: No focal deficit present.     Comments: A&O x1.  History of dementia.  Moving all 4 extremities and following commands.  Psychiatric:        Mood and Affect: Mood normal.        Behavior: Behavior normal.   ED Results / Procedures / Treatments   Labs (all labs ordered are listed, but only abnormal results are displayed) Labs Reviewed - No data to display  EKG None  Radiology CT Head Wo Contrast  Result Date: 07/06/2021 CLINICAL DATA:  Headaches EXAM: CT HEAD WITHOUT CONTRAST TECHNIQUE: Contiguous axial images were obtained from the base of the skull through the vertex without intravenous contrast. RADIATION DOSE REDUCTION: This exam was performed according to the departmental dose-optimization program which includes automated exposure control, adjustment of the mA and/or kV according to patient size and/or use of iterative reconstruction technique. COMPARISON:  11/06/2011 FINDINGS: Brain: No acute intracranial findings are seen. There are no signs of bleeding within the cranium. There is interval dilation of third and both lateral ventricles. There is decreased density in the periventricular white matter. Cortical sulci are slightly prominent. Ventricular dilation is out of proportion to cortical atrophy suggesting possible normal pressure hydrocephalus. Vascular: Unremarkable. Skull: No fracture is seen. Sinuses/Orbits: Visualized paranasal sinuses are unremarkable. Postsurgical changes are noted in  both optic globes. Other: There is increased amount of CSF insula suggesting possible partial empty sella. IMPRESSION: There are no signs of bleeding within the cranium. There is no focal mass effect. There is dilation of third and both lateral ventricles out of proportion to cortical atrophy suggesting possible normal pressure hydrocephalus. Electronically Signed   By: Ernie Avena M.D.   On: 07/06/2021 20:38   DG Hip Unilat W or Wo Pelvis 2-3 Views Left  Result Date: 07/06/2021 CLINICAL DATA:  Left hip pain.  No trauma history submitted. EXAM: DG HIP (WITH OR WITHOUT PELVIS) 2-3V LEFT COMPARISON:  None Available. FINDINGS: AP view of the pelvis  and AP/frog leg views of the left hip. Left hip arthroplasty. Right proximal femur fixation. Presacral stimulator. Marked osteopenia. Sacroiliac joints are symmetric. Large amount of stool projects over the low pelvis, limiting evaluation. No acute fracture. Vascular calcifications. IMPRESSION: No acute osseous abnormality. Electronically Signed   By: Jeronimo GreavesKyle  Talbot M.D.   On: 07/06/2021 17:31    Procedures Procedures   Medications Ordered in ED Medications  acetaminophen (TYLENOL) tablet 500 mg (500 mg Oral Given 07/06/21 1703)   ED Course/ Medical Decision Making/ A&P                           Medical Decision Making Amount and/or Complexity of Data Reviewed Radiology: ordered.  Risk OTC drugs.   Pt is a 88 y.o. female with a history of advanced dementia who presents to the emergency department with her daughter at bedside.  Her daughter states that she has been having vague complaints of left-sided pain and appears to be pointing at her left hip when noticing the pain.  As also been complaining of intermittent headaches.  Imaging: CT scan of the head without contrast shows no signs of bleeding within the cranium.  There is no focal mass effect.  There is dilation of the third and both lateral ventricles out of proportion to cortical atrophy  suggesting possible normal pressure hydrocephalus.  X-ray of the left hip and pelvis shows no acute osseous abnormality.  I, Placido SouLogan Weiland Tomich, PA-C, personally reviewed and evaluated these images and lab results as part of my medical decision-making.  On my exam patient is A&O x1.  Daughter at bedside states that this is her baseline.  Patient appears to have some possible mild tenderness to palpation in the left hip but has full range of motion of all the joints of the lower extremities.  No pain noted with ROM of the left hip. Palpable pedal pulses.  I obtained a CT scan of the head as well as an x-ray of the left hip and pelvis with findings as noted above.  X-rays appear reassuring.  CT does show dilation of the third and both lateral ventricles suggesting possible normal pressure hydrocephalus.  Discussed with my attending physician Dr. Benjiman CoreNathan Pickering.  Agrees with discharge home and outpatient neurology follow-up.  This was discussed with the patient's daughters at bedside who are agreeable with this plan.  Recommended continued use of Tylenol for management of her possible hip pain.  Discussed return precautions in length.  Patient appears stable for discharge at this time and her daughters are agreeable.  Their questions were answered and they were amicable at the time of discharge.  Note: Portions of this report may have been transcribed using voice recognition software. Every effort was made to ensure accuracy; however, inadvertent computerized transcription errors may be present.   Final Clinical Impression(s) / ED Diagnoses Final diagnoses:  Left hip pain    Rx / DC Orders ED Discharge Orders     None         Placido SouJoldersma, Acquanetta Cabanilla, PA-C 07/07/21 1315    Benjiman CorePickering, Nathan, MD 07/12/21 [phone]

## 2023-12-29 IMAGING — DX DG HIP (WITH OR WITHOUT PELVIS) 2-3V*L*
3 series · 3 of 3 positions shown · non-contrast
Comparison: None Available.

CLINICAL DATA: Left hip pain.  No trauma history submitted.

EXAM:
DG HIP (WITH OR WITHOUT PELVIS) 2-3V LEFT

[pelvis ap]
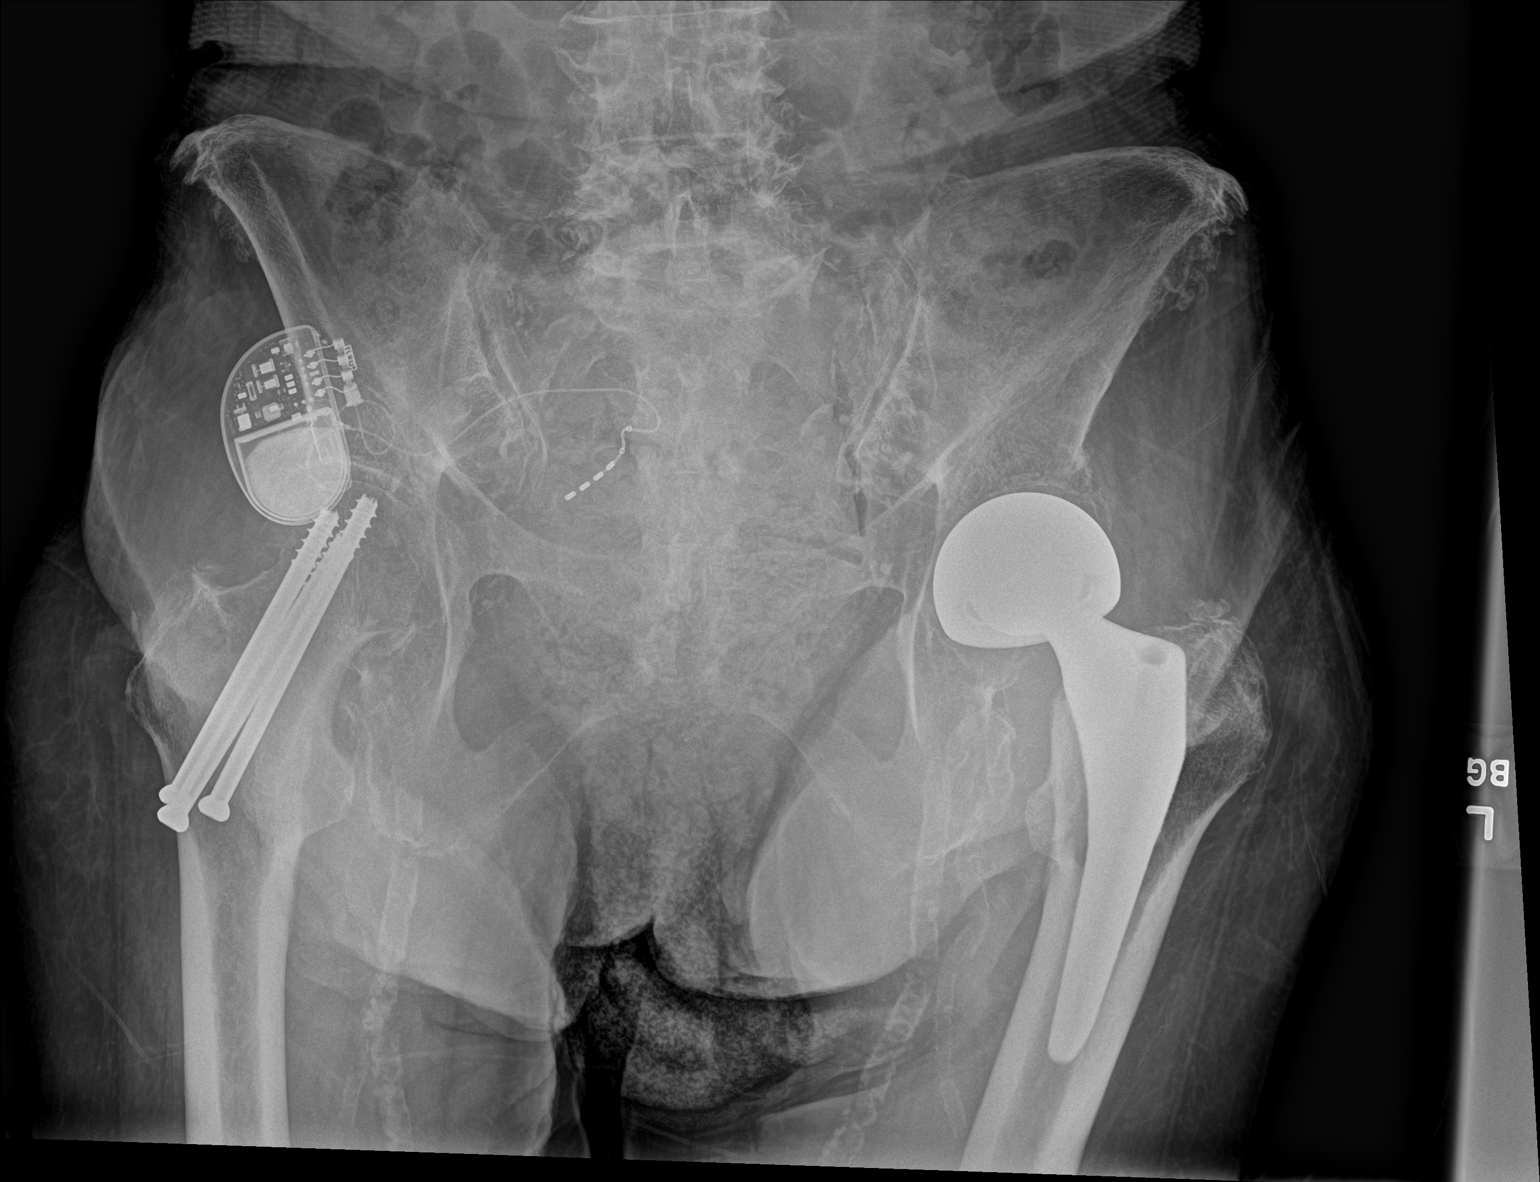

[hip ap]
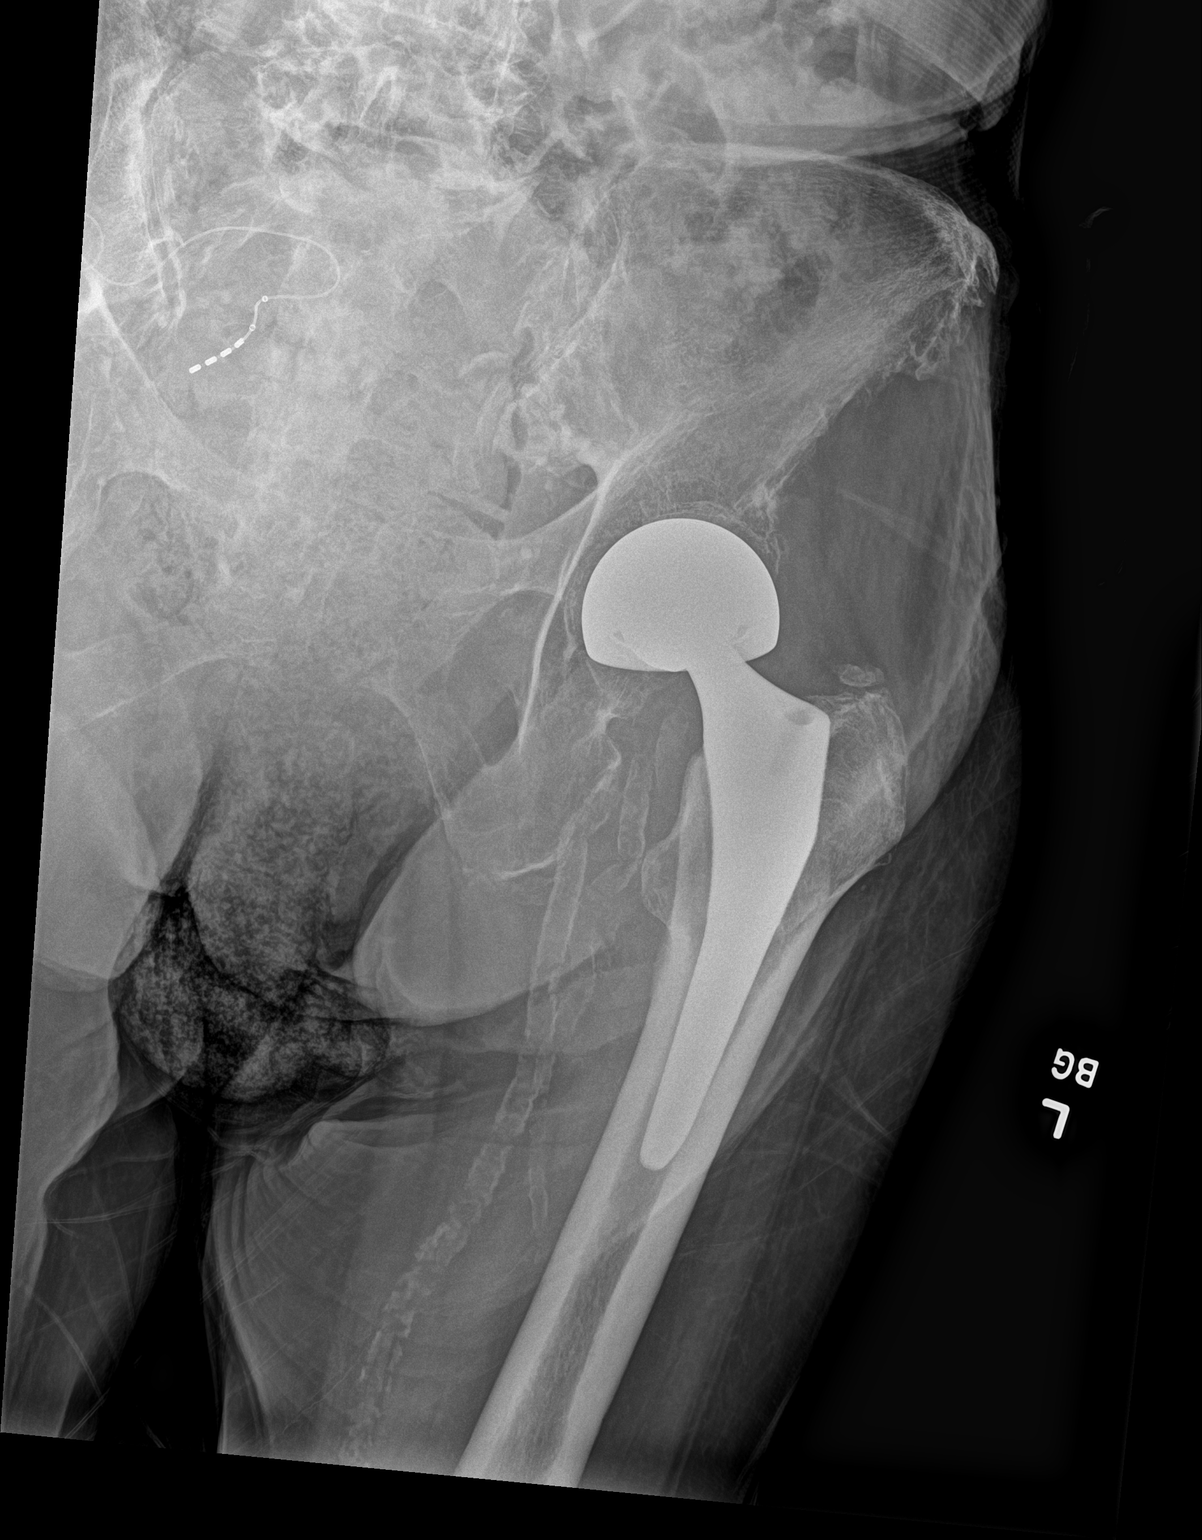

[hip lat]
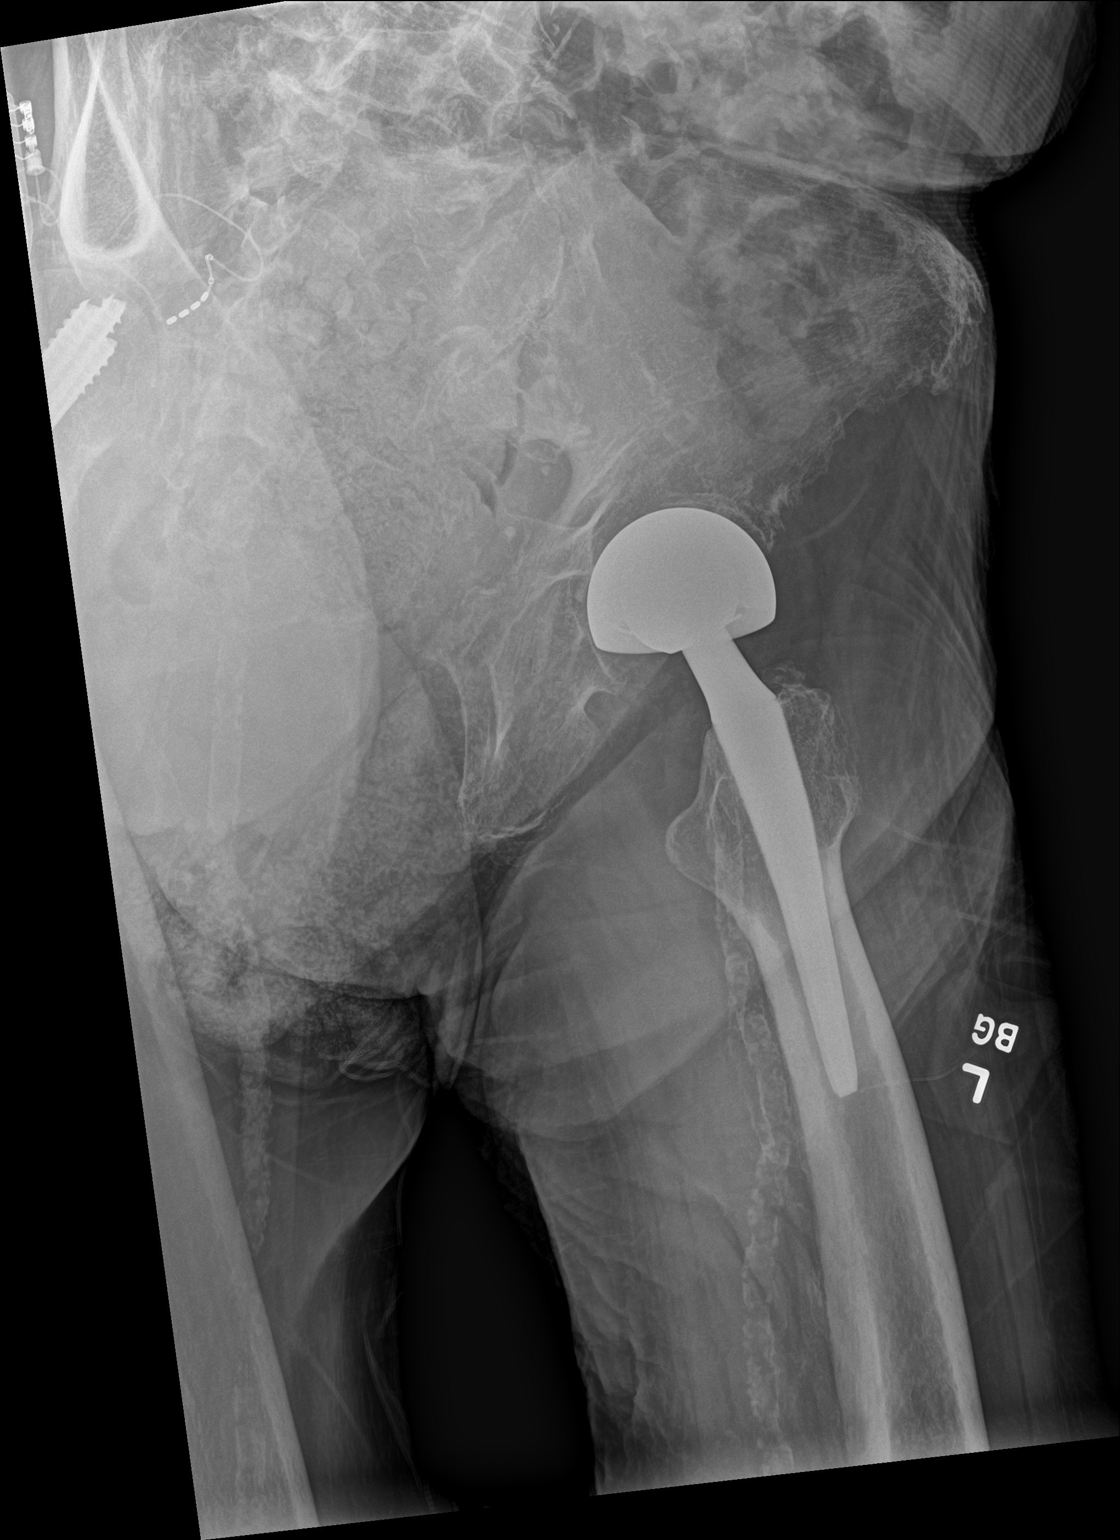

[3 of 3 positions shown; findings below may reference images not displayed]

FINDINGS: AP view of the pelvis and AP/frog leg views of the left hip. Left
hip arthroplasty. Right proximal femur fixation. Presacral
stimulator. Marked osteopenia. Sacroiliac joints are symmetric.
Large amount of stool projects over the low pelvis, limiting
evaluation. No acute fracture. Vascular calcifications.
IMPRESSION: No acute osseous abnormality.

## 2023-12-29 IMAGING — CT CT HEAD W/O CM
4 series · 16 of 47 positions shown, 18 images · non-contrast
Comparison: 11/06/2011

CLINICAL DATA: Headaches



[Series 2: head w o · axial · 0.42mm/px · z∈[-40,+80]mm · 7 of 32 slices shown, 9 images]
[im 4/32  brain]
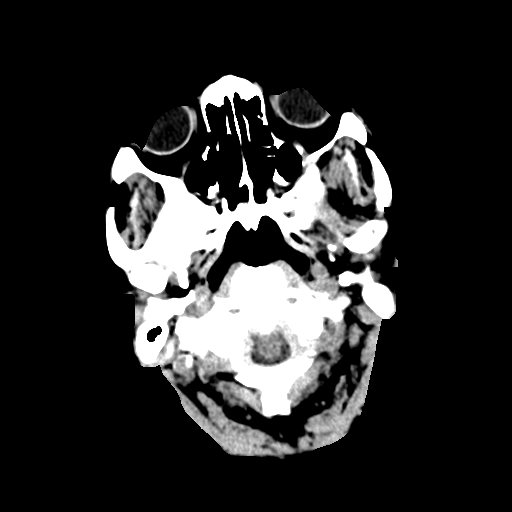
[im 4/32  bone]
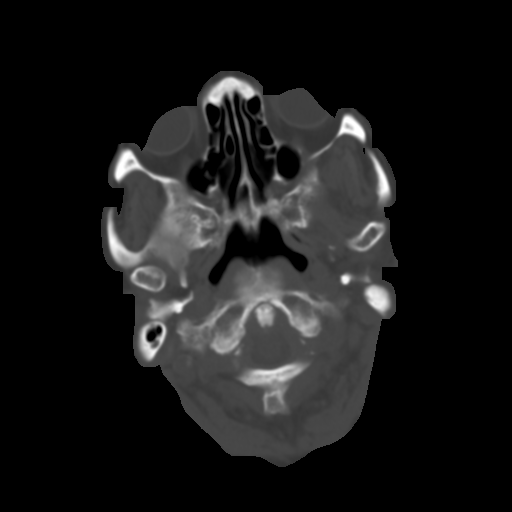
[im 8/32  brain]
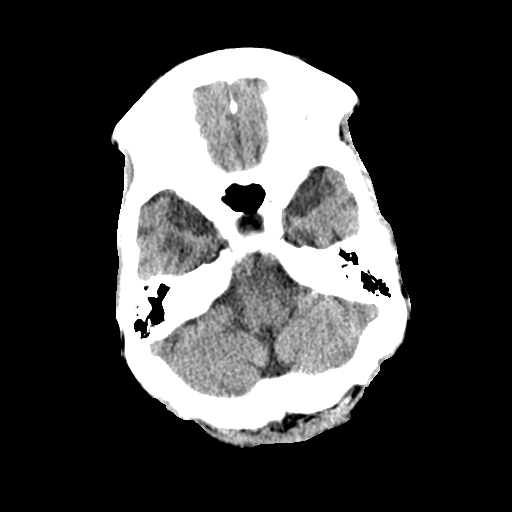
[im 12/32  brain]
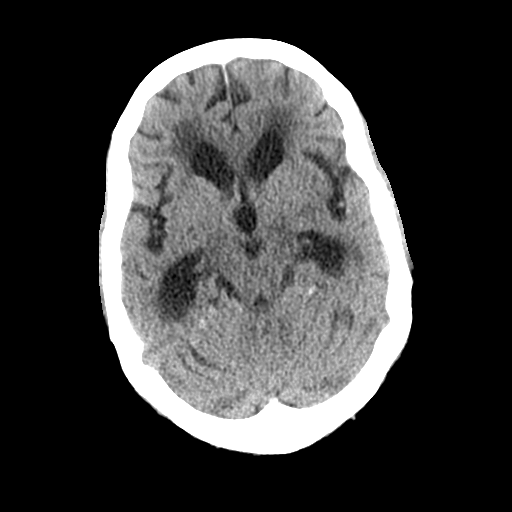
[im 16/32  brain]
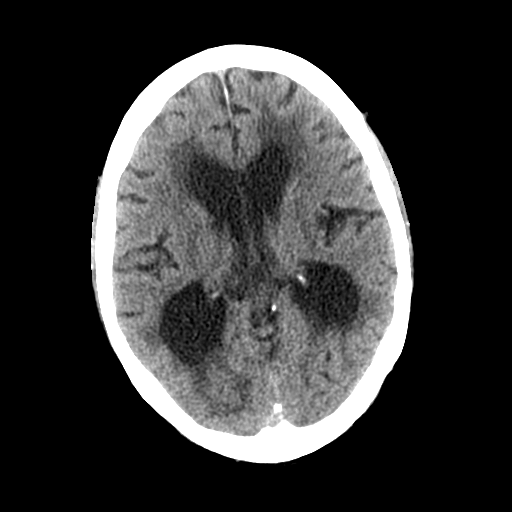
[im 20/32  brain]
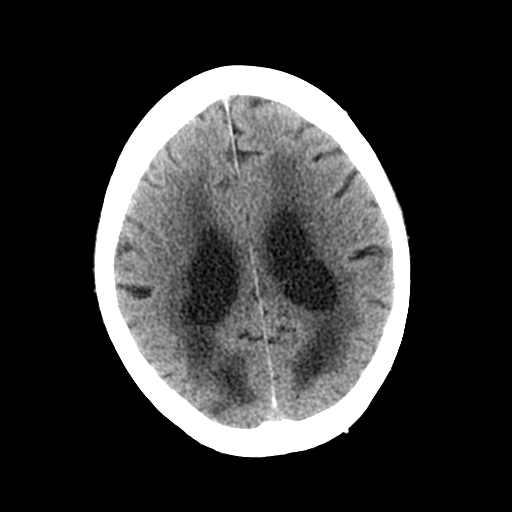
[im 20/32  bone]
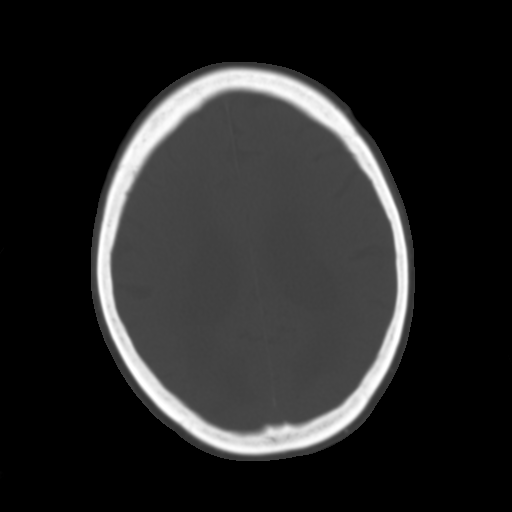
[im 24/32  brain]
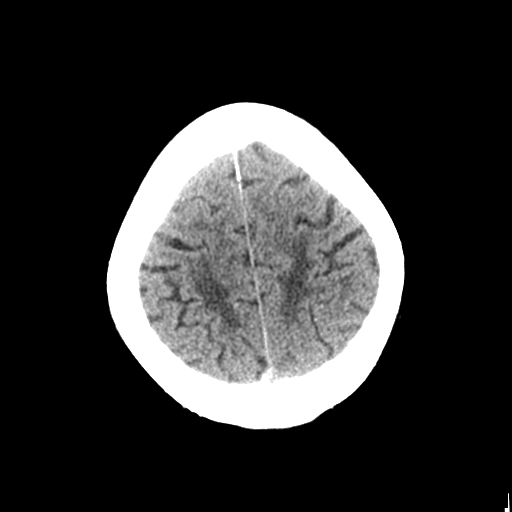
[im 28/32  brain]
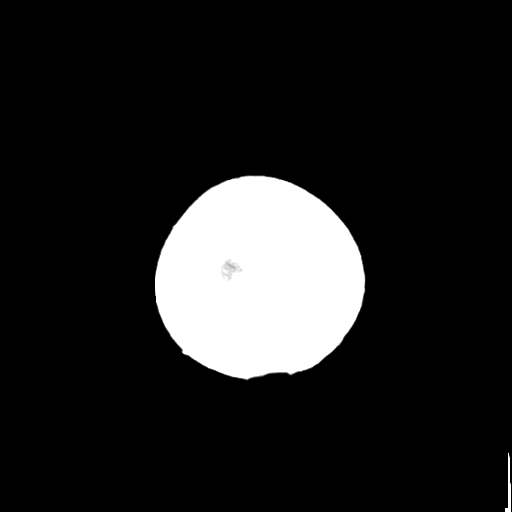

[Series 3: head bone · axial · 0.42mm/px · z∈[-41,-9]mm · 3 of 79 slices shown]
[im 8/79  bone]
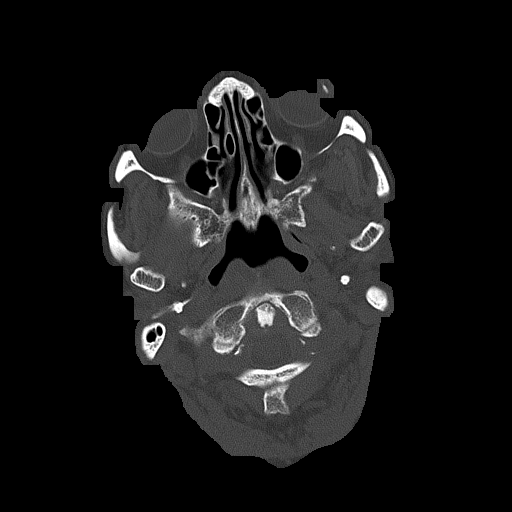
[im 16/79  bone]
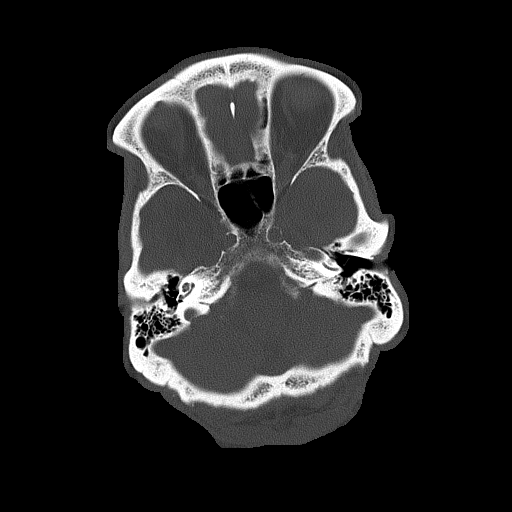
[im 24/79  bone]
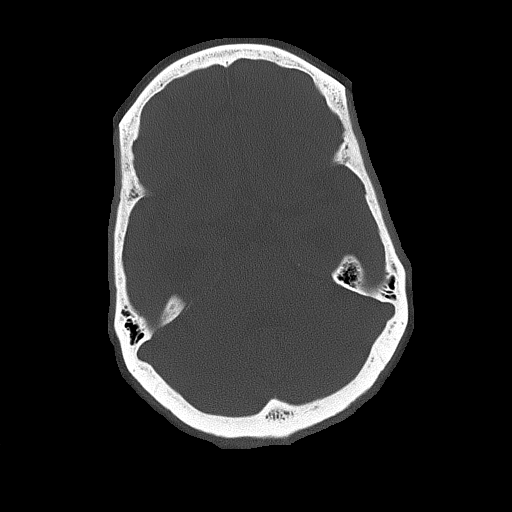

[Series 4: coronal soft · coronal · 0.29mm/px · 3 of 68 slices shown]
[im 23/68  brain]
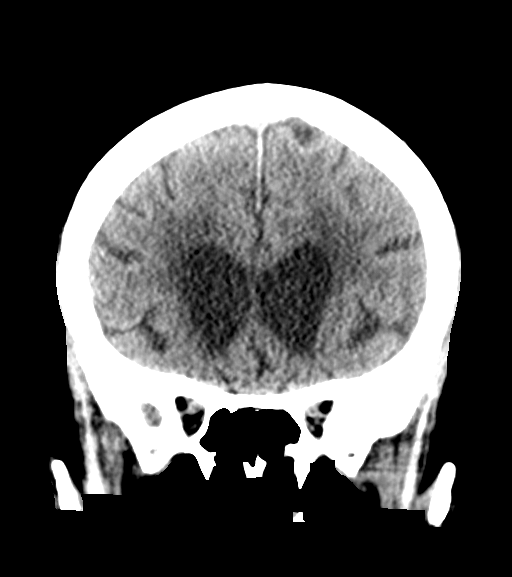
[im 30/68  brain]
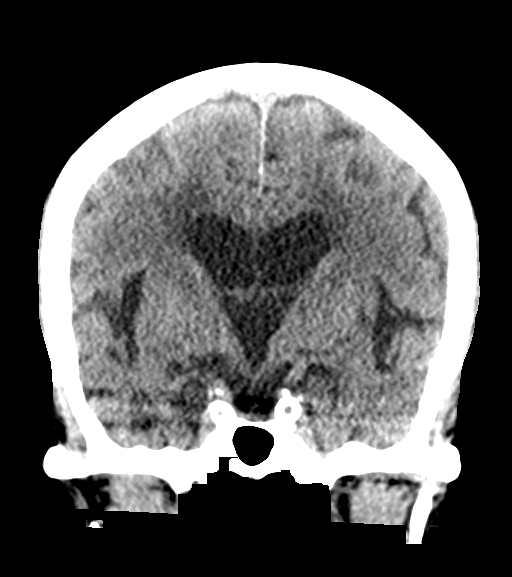
[im 38/68  brain]
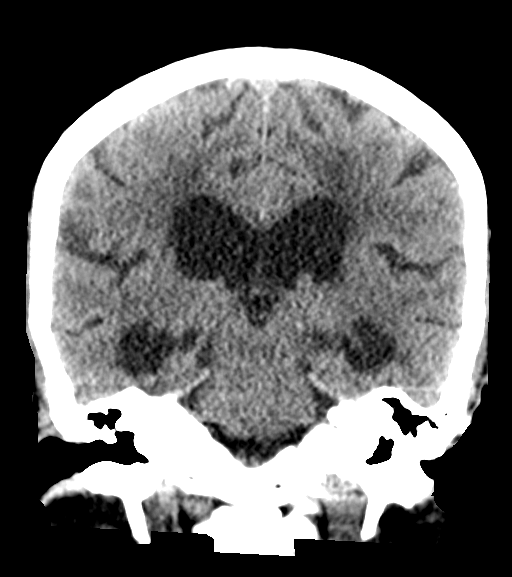

[Series 5: sagittal soft · sagittal · 0.33mm/px · 3 of 50 slices shown]
[im 17/50  brain]
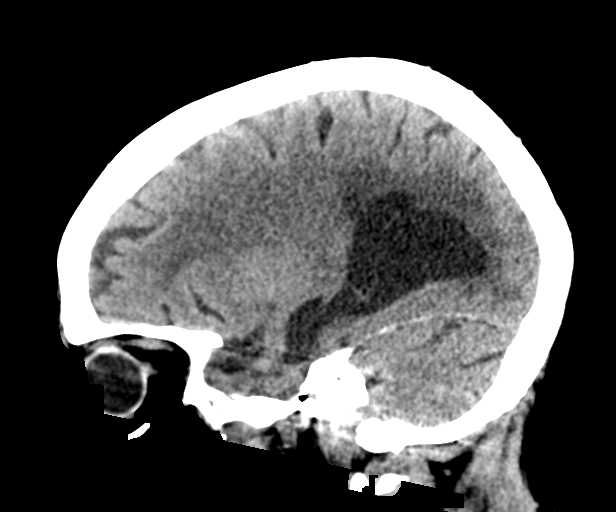
[im 25/50  brain]
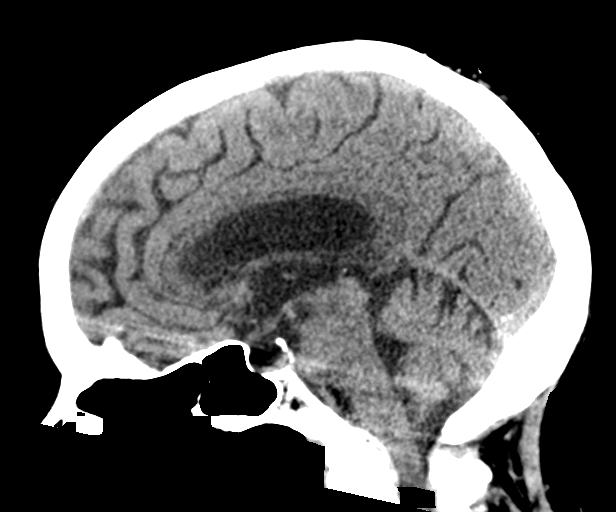
[im 33/50  brain]
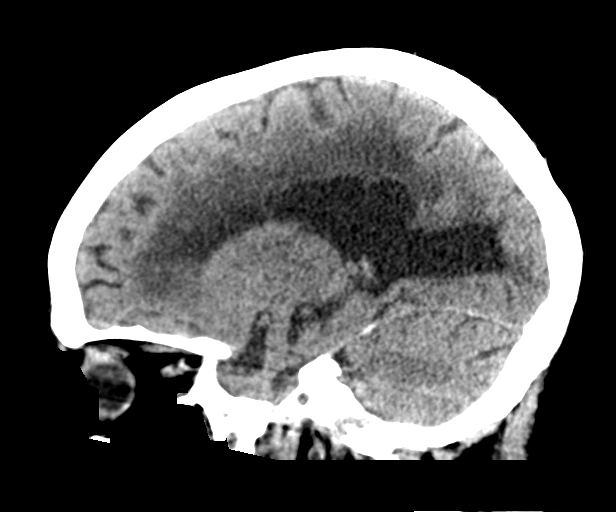

[16 of 47 positions shown; findings below may reference images not displayed]

FINDINGS: Brain: No acute intracranial findings are seen. There are no signs
of bleeding within the cranium. There is interval dilation of third
and both lateral ventricles. There is decreased density in the
periventricular white matter. Cortical sulci are slightly prominent.
Ventricular dilation is out of proportion to cortical atrophy
suggesting possible normal pressure hydrocephalus.

Vascular: Unremarkable.

Skull: No fracture is seen.

Sinuses/Orbits: Visualized paranasal sinuses are unremarkable.
Postsurgical changes are noted in both optic globes.

Other: There is increased amount of CSF insula suggesting possible
partial empty sella.
IMPRESSION: There are no signs of bleeding within the cranium. There is no focal
mass effect.

There is dilation of third and both lateral ventricles out of
proportion to cortical atrophy suggesting possible normal pressure
hydrocephalus.
